# Patient Record
Sex: Male | Born: 1950 | Race: Black or African American | Hispanic: No | Marital: Married | Smoking: Former smoker
Health system: Southern US, Community
[De-identification: ages and names within clinical notes are randomized; demographics above are authoritative.]

## PROBLEM LIST (undated history)

## (undated) DIAGNOSIS — I4581 Long QT syndrome: Secondary | ICD-10-CM

## (undated) DIAGNOSIS — E1165 Type 2 diabetes mellitus with hyperglycemia: Secondary | ICD-10-CM

## (undated) DIAGNOSIS — G2581 Restless legs syndrome: Secondary | ICD-10-CM

## (undated) DIAGNOSIS — R7302 Impaired glucose tolerance (oral): Secondary | ICD-10-CM

## (undated) DIAGNOSIS — N529 Male erectile dysfunction, unspecified: Secondary | ICD-10-CM

## (undated) DIAGNOSIS — F32A Depression, unspecified: Secondary | ICD-10-CM

## (undated) DIAGNOSIS — K219 Gastro-esophageal reflux disease without esophagitis: Secondary | ICD-10-CM

## (undated) DIAGNOSIS — N400 Enlarged prostate without lower urinary tract symptoms: Secondary | ICD-10-CM

## (undated) DIAGNOSIS — F200 Paranoid schizophrenia: Secondary | ICD-10-CM

## (undated) DIAGNOSIS — I10 Essential (primary) hypertension: Secondary | ICD-10-CM

## (undated) DIAGNOSIS — F329 Major depressive disorder, single episode, unspecified: Secondary | ICD-10-CM

## (undated) DIAGNOSIS — E785 Hyperlipidemia, unspecified: Secondary | ICD-10-CM

## (undated) HISTORY — DX: Paranoid schizophrenia: F20.0

## (undated) HISTORY — DX: Gastro-esophageal reflux disease without esophagitis: K21.9

## (undated) HISTORY — DX: Impaired glucose tolerance (oral): R73.02

## (undated) HISTORY — DX: Restless legs syndrome: G25.81

## (undated) HISTORY — DX: Male erectile dysfunction, unspecified: N52.9

## (undated) HISTORY — DX: Hyperlipidemia, unspecified: E78.5

## (undated) HISTORY — DX: Benign prostatic hyperplasia without lower urinary tract symptoms: N40.0

## (undated) HISTORY — DX: Major depressive disorder, single episode, unspecified: F32.9

## (undated) HISTORY — DX: Depression, unspecified: F32.A

## (undated) HISTORY — DX: Long QT syndrome: I45.81

## (undated) HISTORY — DX: Essential (primary) hypertension: I10

## (undated) HISTORY — DX: Type 2 diabetes mellitus with hyperglycemia: E11.65

---

## 2001-02-05 ENCOUNTER — Emergency Department (HOSPITAL_COMMUNITY): Admission: EM | Admit: 2001-02-05 | Discharge: 2001-02-05 | Payer: Self-pay | Admitting: Emergency Medicine

## 2001-02-05 ENCOUNTER — Encounter: Payer: Self-pay | Admitting: Emergency Medicine

## 2002-01-06 ENCOUNTER — Emergency Department (HOSPITAL_COMMUNITY): Admission: EM | Admit: 2002-01-06 | Discharge: 2002-01-06 | Payer: Self-pay | Admitting: Emergency Medicine

## 2002-05-26 ENCOUNTER — Inpatient Hospital Stay (HOSPITAL_COMMUNITY): Admission: EM | Admit: 2002-05-26 | Discharge: 2002-05-31 | Payer: Self-pay | Admitting: Psychiatry

## 2005-08-03 ENCOUNTER — Inpatient Hospital Stay (HOSPITAL_COMMUNITY): Admission: EM | Admit: 2005-08-03 | Discharge: 2005-08-09 | Payer: Self-pay | Admitting: Emergency Medicine

## 2005-08-10 HISTORY — PX: KNEE SURGERY: SHX244

## 2005-09-03 ENCOUNTER — Ambulatory Visit: Payer: Self-pay | Admitting: Internal Medicine

## 2006-03-04 ENCOUNTER — Ambulatory Visit: Payer: Self-pay | Admitting: Internal Medicine

## 2006-03-08 ENCOUNTER — Ambulatory Visit: Payer: Self-pay | Admitting: Internal Medicine

## 2006-03-27 ENCOUNTER — Ambulatory Visit: Payer: Self-pay | Admitting: *Deleted

## 2006-04-05 ENCOUNTER — Ambulatory Visit: Payer: Self-pay

## 2007-05-20 ENCOUNTER — Ambulatory Visit: Payer: Self-pay | Admitting: Internal Medicine

## 2007-05-20 LAB — CONVERTED CEMR LAB
ALT: 19 units/L (ref 0–40)
AST: 18 units/L (ref 0–37)
Albumin: 3.5 g/dL (ref 3.5–5.2)
Alkaline Phosphatase: 58 units/L (ref 39–117)
BUN: 8 mg/dL (ref 6–23)
Basophils Absolute: 0 10*3/uL (ref 0.0–0.1)
Basophils Relative: 0.9 % (ref 0.0–1.0)
Bilirubin Urine: NEGATIVE
Bilirubin, Direct: 0.1 mg/dL (ref 0.0–0.3)
CO2: 29 meq/L (ref 19–32)
Calcium: 9 mg/dL (ref 8.4–10.5)
Chloride: 105 meq/L (ref 96–112)
Cholesterol: 227 mg/dL (ref 0–200)
Creatinine, Ser: 1.5 mg/dL (ref 0.4–1.5)
Direct LDL: 157.3 mg/dL
Eosinophils Absolute: 0.1 10*3/uL (ref 0.0–0.6)
Eosinophils Relative: 1.3 % (ref 0.0–5.0)
GFR calc Af Amer: 62 mL/min
GFR calc non Af Amer: 51 mL/min
Glucose, Bld: 98 mg/dL (ref 70–99)
HCT: 40.9 % (ref 39.0–52.0)
HDL: 25.7 mg/dL — ABNORMAL LOW (ref >39.0)
Hemoglobin, Urine: NEGATIVE
Hemoglobin: 13.8 g/dL (ref 13.0–17.0)
Ketones, ur: NEGATIVE mg/dL
Leukocytes, UA: NEGATIVE
Lymphocytes Relative: 39.1 % (ref 12.0–46.0)
MCHC: 33.7 g/dL (ref 30.0–36.0)
MCV: 89.2 fL (ref 78.0–100.0)
Monocytes Absolute: 0.7 10*3/uL (ref 0.2–0.7)
Monocytes Relative: 12.7 % — ABNORMAL HIGH (ref 3.0–11.0)
Neutro Abs: 2.4 10*3/uL (ref 1.4–7.7)
Neutrophils Relative %: 46 % (ref 43.0–77.0)
Nitrite: NEGATIVE
PSA: 3.23 ng/mL (ref 0.10–4.00)
Platelets: 358 10*3/uL (ref 150–400)
Potassium: 3.8 meq/L (ref 3.5–5.1)
RBC: 4.59 M/uL (ref 4.22–5.81)
RDW: 15 % — ABNORMAL HIGH (ref 11.5–14.6)
Sodium: 140 meq/L (ref 135–145)
Specific Gravity, Urine: 1.015 (ref 1.000–1.03)
TSH: 1.73 microintl units/mL (ref 0.35–5.50)
Total Bilirubin: 0.7 mg/dL (ref 0.3–1.2)
Total CHOL/HDL Ratio: 8.8
Total Protein, Urine: NEGATIVE mg/dL
Total Protein: 7.3 g/dL (ref 6.0–8.3)
Triglycerides: 163 mg/dL — ABNORMAL HIGH (ref 0–149)
Urine Glucose: NEGATIVE mg/dL
Urobilinogen, UA: 0.2 (ref 0.0–1.0)
VLDL: 33 mg/dL (ref 0–40)
WBC: 5.2 10*3/uL (ref 4.5–10.5)
pH: 6.5 (ref 5.0–8.0)

## 2007-06-08 ENCOUNTER — Emergency Department (HOSPITAL_COMMUNITY): Admission: EM | Admit: 2007-06-08 | Discharge: 2007-06-08 | Payer: Self-pay | Admitting: Emergency Medicine

## 2008-01-13 ENCOUNTER — Ambulatory Visit: Payer: Self-pay | Admitting: Internal Medicine

## 2008-01-13 DIAGNOSIS — E785 Hyperlipidemia, unspecified: Secondary | ICD-10-CM

## 2008-01-13 DIAGNOSIS — F200 Paranoid schizophrenia: Secondary | ICD-10-CM

## 2008-01-13 DIAGNOSIS — I10 Essential (primary) hypertension: Secondary | ICD-10-CM | POA: Insufficient documentation

## 2008-01-13 DIAGNOSIS — F329 Major depressive disorder, single episode, unspecified: Secondary | ICD-10-CM

## 2008-09-07 ENCOUNTER — Ambulatory Visit: Payer: Self-pay | Admitting: Internal Medicine

## 2008-09-10 ENCOUNTER — Ambulatory Visit: Payer: Self-pay | Admitting: Internal Medicine

## 2008-09-13 LAB — CONVERTED CEMR LAB
ALT: 29 units/L (ref 0–53)
AST: 24 units/L (ref 0–37)
Albumin: 3.7 g/dL (ref 3.5–5.2)
Alkaline Phosphatase: 52 units/L (ref 39–117)
BUN: 8 mg/dL (ref 6–23)
Basophils Absolute: 0 10*3/uL (ref 0.0–0.1)
Basophils Relative: 0.5 % (ref 0.0–3.0)
Bilirubin Urine: NEGATIVE
Bilirubin, Direct: 0.1 mg/dL (ref 0.0–0.3)
CO2: 31 meq/L (ref 19–32)
Calcium: 8.8 mg/dL (ref 8.4–10.5)
Chloride: 101 meq/L (ref 96–112)
Cholesterol: 215 mg/dL (ref 0–200)
Creatinine, Ser: 1.4 mg/dL (ref 0.4–1.5)
Direct LDL: 153.7 mg/dL
Eosinophils Absolute: 0.1 10*3/uL (ref 0.0–0.7)
Eosinophils Relative: 1.1 % (ref 0.0–5.0)
GFR calc Af Amer: 67 mL/min
GFR calc non Af Amer: 55 mL/min
Glucose, Bld: 143 mg/dL — ABNORMAL HIGH (ref 70–99)
HCT: 41.3 % (ref 39.0–52.0)
HDL: 26.3 mg/dL — ABNORMAL LOW (ref >39.0)
Hemoglobin, Urine: NEGATIVE
Hemoglobin: 14 g/dL (ref 13.0–17.0)
Hgb A1c MFr Bld: 6.1 % — ABNORMAL HIGH (ref 4.6–6.0)
Ketones, ur: NEGATIVE mg/dL
Leukocytes, UA: NEGATIVE
Lymphocytes Relative: 35.8 % (ref 12.0–46.0)
MCHC: 33.8 g/dL (ref 30.0–36.0)
MCV: 90.7 fL (ref 78.0–100.0)
Monocytes Absolute: 0.4 10*3/uL (ref 0.1–1.0)
Monocytes Relative: 7 % (ref 3.0–12.0)
Neutro Abs: 3.2 10*3/uL (ref 1.4–7.7)
Neutrophils Relative %: 55.6 % (ref 43.0–77.0)
Nitrite: NEGATIVE
PSA: 3.31 ng/mL (ref 0.10–4.00)
Platelets: 343 10*3/uL (ref 150–400)
Potassium: 4 meq/L (ref 3.5–5.1)
RBC: 4.56 M/uL (ref 4.22–5.81)
RDW: 15.7 % — ABNORMAL HIGH (ref 11.5–14.6)
Sodium: 138 meq/L (ref 135–145)
Specific Gravity, Urine: 1.015 (ref 1.000–1.03)
TSH: 1.14 microintl units/mL (ref 0.35–5.50)
Total Bilirubin: 0.8 mg/dL (ref 0.3–1.2)
Total CHOL/HDL Ratio: 8.2
Total Protein, Urine: NEGATIVE mg/dL
Total Protein: 7.2 g/dL (ref 6.0–8.3)
Triglycerides: 202 mg/dL (ref 0–149)
Urine Glucose: NEGATIVE mg/dL
Urobilinogen, UA: 0.2 (ref 0.0–1.0)
VLDL: 40 mg/dL (ref 0–40)
WBC: 5.7 10*3/uL (ref 4.5–10.5)
pH: 6.5 (ref 5.0–8.0)

## 2008-09-23 ENCOUNTER — Encounter (INDEPENDENT_AMBULATORY_CARE_PROVIDER_SITE_OTHER): Payer: Self-pay | Admitting: *Deleted

## 2009-09-16 ENCOUNTER — Ambulatory Visit: Payer: Self-pay | Admitting: Internal Medicine

## 2009-09-16 DIAGNOSIS — F17200 Nicotine dependence, unspecified, uncomplicated: Secondary | ICD-10-CM | POA: Insufficient documentation

## 2009-09-16 DIAGNOSIS — R5383 Other fatigue: Secondary | ICD-10-CM

## 2009-09-16 DIAGNOSIS — R5381 Other malaise: Secondary | ICD-10-CM

## 2009-09-19 DIAGNOSIS — R972 Elevated prostate specific antigen [PSA]: Secondary | ICD-10-CM

## 2009-09-20 LAB — CONVERTED CEMR LAB
AST: 21 units/L (ref 0–37)
Albumin: 3.8 g/dL (ref 3.5–5.2)
Alkaline Phosphatase: 56 units/L (ref 39–117)
Basophils Absolute: 0 10*3/uL (ref 0.0–0.1)
Basophils Relative: 0.6 % (ref 0.0–3.0)
Bilirubin, Direct: 0.1 mg/dL (ref 0.0–0.3)
Calcium: 9.6 mg/dL (ref 8.4–10.5)
Creatinine, Ser: 1.8 mg/dL — ABNORMAL HIGH (ref 0.4–1.5)
Direct LDL: 175.8 mg/dL
Eosinophils Absolute: 0.1 10*3/uL (ref 0.0–0.7)
GFR calc non Af Amer: 49.75 mL/min (ref >60)
HDL: 27.4 mg/dL — ABNORMAL LOW (ref >39.00)
Hemoglobin: 13.9 g/dL (ref 13.0–17.0)
Lymphocytes Relative: 32.5 % (ref 12.0–46.0)
Monocytes Relative: 10.1 % (ref 3.0–12.0)
Neutro Abs: 3.3 10*3/uL (ref 1.4–7.7)
Neutrophils Relative %: 55.3 % (ref 43.0–77.0)
PSA: 4.46 ng/mL — ABNORMAL HIGH (ref 0.10–4.00)
RBC: 4.63 M/uL (ref 4.22–5.81)
Sodium: 141 meq/L (ref 135–145)
Total CHOL/HDL Ratio: 8
Total Protein: 7 g/dL (ref 6.0–8.3)
Triglycerides: 157 mg/dL — ABNORMAL HIGH (ref 0.0–149.0)

## 2009-09-30 ENCOUNTER — Encounter: Payer: Self-pay | Admitting: Internal Medicine

## 2009-10-26 ENCOUNTER — Encounter: Payer: Self-pay | Admitting: Internal Medicine

## 2010-02-23 ENCOUNTER — Encounter: Payer: Self-pay | Admitting: Internal Medicine

## 2010-09-18 ENCOUNTER — Ambulatory Visit: Payer: Self-pay | Admitting: Internal Medicine

## 2010-09-18 ENCOUNTER — Encounter: Payer: Self-pay | Admitting: Internal Medicine

## 2010-09-18 DIAGNOSIS — K219 Gastro-esophageal reflux disease without esophagitis: Secondary | ICD-10-CM

## 2010-09-18 DIAGNOSIS — R21 Rash and other nonspecific skin eruption: Secondary | ICD-10-CM

## 2010-09-18 LAB — CONVERTED CEMR LAB
ALT: 22 units/L (ref 0–53)
AST: 22 units/L (ref 0–37)
Alkaline Phosphatase: 53 units/L (ref 39–117)
BUN: 10 mg/dL (ref 6–23)
Basophils Relative: 1.5 % (ref 0.0–3.0)
Bilirubin Urine: NEGATIVE
Bilirubin, Direct: 0.1 mg/dL (ref 0.0–0.3)
Chloride: 104 meq/L (ref 96–112)
Creatinine, Ser: 1.4 mg/dL (ref 0.4–1.5)
Eosinophils Relative: 2.4 % (ref 0.0–5.0)
GFR calc non Af Amer: 65.73 mL/min (ref >60)
Hemoglobin, Urine: NEGATIVE
LDL Cholesterol: 119 mg/dL — ABNORMAL HIGH (ref 0–99)
Lymphocytes Relative: 37.9 % (ref 12.0–46.0)
Monocytes Relative: 10.5 % (ref 3.0–12.0)
Neutrophils Relative %: 47.7 % (ref 43.0–77.0)
Nitrite: NEGATIVE
Platelets: 363 10*3/uL (ref 150.0–400.0)
RBC: 4.78 M/uL (ref 4.22–5.81)
TSH: 1.38 microintl units/mL (ref 0.35–5.50)
Total Bilirubin: 0.8 mg/dL (ref 0.3–1.2)
Total CHOL/HDL Ratio: 5
Total Protein, Urine: NEGATIVE mg/dL
Total Protein: 7.7 g/dL (ref 6.0–8.3)
Triglycerides: 87 mg/dL (ref 0.0–149.0)
Urobilinogen, UA: 0.2 (ref 0.0–1.0)
WBC: 5.9 10*3/uL (ref 4.5–10.5)

## 2010-09-20 ENCOUNTER — Telehealth: Payer: Self-pay | Admitting: Internal Medicine

## 2010-09-26 ENCOUNTER — Encounter (INDEPENDENT_AMBULATORY_CARE_PROVIDER_SITE_OTHER): Payer: Self-pay | Admitting: *Deleted

## 2010-11-15 ENCOUNTER — Encounter: Payer: Self-pay | Admitting: Internal Medicine

## 2010-12-18 ENCOUNTER — Encounter: Payer: Self-pay | Admitting: Internal Medicine

## 2011-01-11 NOTE — Miscellaneous (Signed)
Summary: Orders Update  Clinical Lists Changes  Orders: Added new Referral order of Urology Referral (Urology) - Signed 

## 2011-01-11 NOTE — Miscellaneous (Signed)
Summary: Orders Update   Clinical Lists Changes  Orders: Added new Service order of Est. Patient 40-64 years ([id number]) - Signed

## 2011-01-11 NOTE — Letter (Signed)
Summary: Referral - not able to see patient  [facility]  [address] [place], [place] [address]   Phone: [phone]  Fax: [phone]    February 23, 2010    Corwin Levins, M.D. [address] [place], [place] [address]   Re:   Calvin Wilcox DOB:  Nov 28, 1951 MRN:   [id number]    Dear Dr. [name]:  Thank you for your kind referral of the above patient.  We have attempted to schedule the recommended procedure Screening Colonoscopy but have not been able to schedule because:   X  The patient was not available by phone and/or has not returned our calls.  ___ The patient declined to schedule the procedure at this time.  We appreciate the referral and hope that we will have the opportunity to treat this patient in the future.    Sincerely,    [facility] [phone]

## 2011-01-11 NOTE — Letter (Signed)
Summary: [facility]  [facility]   Imported By: Lester [place] 11/24/2010 10:24:14  _____________________________________________________________________  External Attachment:    Type:   Image     Comment:   External Document

## 2011-01-11 NOTE — Letter (Signed)
Summary: [facility] Consult Scheduled Letter  [facility]  [address] [place], [place] [address]   Phone: [phone]  Fax: [phone]      09/26/2010 MRN: [id number]  [facility] [address] [place], [place]  [address]    Dear Mr. Favaro,      We have scheduled an appointment for you.  At the recommendation of Dr.John, we have scheduled you a consult with Dr Laverle Patter on 10/06/10 at 1:30pm.  Their phone number is [phone].  If this appointment day and time is not convenient for you, please feel free to call the office of the doctor you are being referred to at the number listed above and reschedule the appointment.    [facility] [address] [place], [place] [address]   Enclosure:Directions   Thank you,  Patient Care Coordinator [facility]

## 2011-01-11 NOTE — Progress Notes (Signed)
  Phone Note Outgoing Call   Call placed by: Robin Call placed to: Patient Summary of Call: called patient and informed pts. wife to hold on Simvastatin while patient is taking Ketoconoazole per [employer] instructions. Patients wife agreed to inform pt. and also informed pharmacy that pt. was informed of instructions. Initial call taken by: Robin Ewing CMA (AAMA),  September 20, 2010 9:20 AM
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
## 2011-01-11 NOTE — Letter (Signed)
Summary: Referral - not able to see patient  [facility]  [address] [place], [place] [address]   Phone: [phone]  Fax: [phone]    December 18, 2010   Oliver Barre, MD [address] [place], [place] [address]   Re:   Calvin Wilcox DOB:  Dec 30, 1950 MRN:   [id number]    Dear Dr. [name]:  Thank you for your kind referral of the above patient.  We have attempted to schedule the recommended procedure Screening Colonoscopy but have not been able to schedule because:  X   The patient was not available by phone and/or has not returned our calls.  ___ The patient declined to schedule the procedure at this time.  We appreciate the referral and hope that we will have the opportunity to treat this patient in the future.    Sincerely,    [facility] [phone]
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
## 2011-04-27 NOTE — H&P (Signed)
[facility]  Patient:    Calvin Wilcox, Calvin Wilcox Visit Number: [id number] MRN: [id number]          Service Type: PSY Location: 400 [id number] 02 Attending Physician:  Rachael Fee Dictated by:   Candi Leash. Orsini, N.P. Admit Date:  05/26/2002                     Psychiatric Admission Assessment  IDENTIFYING INFORMATION:  This is a 60 year old divorced African-American male voluntarily admitted for psychosis on May 26, 2002.  HISTORY OF PRESENT ILLNESS:  The patient presents with a history of psychosis, having positive auditory hallucinations, the command-type to "watch the lady," speaking about his girlfriend.  He has a history of auditory hallucinations for at least six years.  He has never been treated.  He reports that he believes his girlfriend is cheating on him.  The patient states he crawls on the floor.  He peeps around to watch her.  He is looking for any sort of sexual activity.  Putting his ear to the wall, trying to hear her having sex. He reports his mother recently found out that he has been hearing voices and advised him to seek treatment.  He reports some depressive symptoms.  Denies any specific stressors.  He reports decreased sleep.  States the voices wake him up at night.  His appetite is satisfactory.  He has a past history of homicidal thoughts towards his girlfriend in the past but none now.  He has never been on any medication.  PAST PSYCHIATRIC HISTORY:  First hospitalization to [facility]. No other hospitalizations.  No outpatient treatment.  No history of a suicide attempt.  No history of violence.  SOCIAL HISTORY:  This is a 60 year old divorced, African-American male, divorced for one year.  He has three children, one stepdaughter.  He lives with his girlfriend.  Her name is Elma.  He works at [employer].  He has completed the 12th grade.  He has a court date pending for drug paraphernalia.  He is currently on probation for  drug paraphernalia and regarding unauthorized unemployment.  FAMILY HISTORY:  None.  ALCOHOL/DRUG HISTORY:  The patient smokes.  He uses beer and wine on occasion. States it is not a problem for him.  Has been using crack cocaine on occasion. No history of IV drug use.  PRIMARY CARE [place] Dietrick:  Dr. [name] in [place].  MEDICAL PROBLEMS:  None.  MEDICATIONS:  None.  DRUG ALLERGIES:  No known allergies.  PHYSICAL EXAMINATION:  VITAL SIGNS:  The patient is 5 feet 9 inches tall.  He is 144 pounds.  His heart rate is 66, respiratory rate is 20, blood pressure 140/80.  GENERAL APPEARANCE:  The patient is a 60 year old African-American male in no acute distress.  He is average weight and appears his stated age.  The patient is unkempt, alert and cooperative.  HEENT:  Head is normocephalic.  Hair is short, clean.  He can raise his eyebrows.  Hair is evenly distributed.  EOMs are intact bilaterally.  External ear canals are patent.  Hearing is appropriate to conversation.  No sinus tenderness.  No nasal discharge.  Poor dentition.  The patient has multiple missing and broken teeth on the top.  No lesions were seen.  Tongue protrudes midline without tremor.  NECK:  Supple.  No JVD.  Negative lymphadenopathy.  Thyroid is nonpalpable, nontender.  CHEST:  Clear to auscultation.  No adventitious sounds.  No cough.  HEART:  Rate is regular rate and rhythm without murmurs, gallops or rubs.  GENITALIA:  Exam is deferred.  ABDOMEN:  Soft, flat, nontender abdomen.  MUSCULOSKELETAL:  No joint swelling or deformity.  Good range of motion. Muscle strength and tone is equal bilaterally.  No signs of injury.  SKIN:  Warm and dry with good turgor.  Strong bilateral radial pulses.  Nail beds are pink with good capillary refill.  NEURO:  Cranial nerves are grossly intact.  Good grip strength bilaterally. No involuntary movements.  Cerebellar function intact with heel to shin and normal  alternating movements.  LABORATORY DATA:  CBC within normal limits.  CMET within normal limits. Alcohol level was less than 5.  MENTAL STATUS EXAMINATION:  Middle-aged, average weight, casually dressed, calm, cooperative male with good eye contact.  Not guarded.  Speech is clear. Affect is flat.  Thought processes are positive paranoia, positive delusions, positive auditory hallucinations, positive visual hallucinations, seeing his girlfriend at times.  No suicidal or homicidal ideation.  Cognitive function intact.  Memory is good.  Judgment is fair.  Insight is fair.  The patient realizes that this is not appropriate.  DIAGNOSES: Axis I:    1. Psychosis not otherwise specified.            2. Rule out major depression with psychotic features.            3. Rule out substance-induced psychosis. Axis II:   Deferred. Axis III:  None. Axis IV:   Problems with primary support group. Axis V:    Current 35; this past year 56.  PLAN:  Voluntary admission for psychosis.  Contract for safety.  Check every 15 minutes.  Will obtain labs.  Will initiate Risperdal for psychosis and paranoid behavior.  Attend groups.  Have a family session with girlfriend prior to discharge.  TENTATIVE LENGTH OF STAY:  Four to six days. Dictated by:   Candi Leash. Orsini, N.P. Attending Physician:  Rachael Fee DD:  05/27/02 TD:  05/27/02 Job: [id number]
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
## 2011-04-27 NOTE — Discharge Summary (Signed)
NAME:  Calvin Wilcox, Calvin Wilcox                             ACCOUNT NO.:  [id number]   MEDICAL RECORD NO.:  [id number]                   PATIENT TYPE:  PS   LOCATION:  0407                                 FACILITY:  [facility]   PHYSICIAN:  Irving A. Dub Mikes, M.D.                DATE OF BIRTH:  [birth year]   DATE OF ADMISSION:  05/26/2002  DATE OF DISCHARGE:  05/31/2002                                 DISCHARGE SUMMARY   CHIEF COMPLAINT AND PRESENT ILLNESS:  This was the first admission to [facility] for this 60 year old divorced, African-American male  voluntarily admitted for psychosis.  Positive auditory hallucinations,  command-type, to watch the lady, speaking about his girlfriend.  History  of auditory hallucinations for at least six years.  Never been treated.  Feels like his girlfriend cheated on him, clothes on the floor, he peeks  around and watch her.  He is looking for any sort of sexual activity.  Putting his ear to the wall, trying to hear her having sex.  Mother recently  found out that he has been hearing voices and advised him to seek treatment.   PAST PSYCHIATRIC HISTORY:  First time at [facility].  No previous  treatment.   ALCOHOL/DRUG HISTORY:  Marijuana on occasion.  Not a problem.  Has been  using crack cocaine on occasion.  No history of IV drug use.   PAST MEDICAL HISTORY:  Noncontributory.   MEDICATIONS:  The patient is not taking any medications.   MENTAL STATUS EXAM:  Well-nourished, well-developed, alert, cooperative male  who is initially quite anxious, overwhelmed of the fact that he was hearing  these voices but, at the same time, relieved that he was able to say  something and was getting treatment.  Did endorse the auditory  hallucinations as well as ideas that the girlfriend might be having these  affairs.  He is having a hard time getting this out of his mind.  Denied any  suicidal or homicidal ideation.  Cognition well-preserved.   ADMISSION DIAGNOSES:   AXIS I:  1. Psychotic disorder not otherwise specified.  2. Rule out cocaine abuse.   AXIS II:  No diagnosis.   AXIS III:  No diagnosis.   AXIS IV:  Moderate.   AXIS V:  Global Assessment of Functioning on admission 35; highest Global  Assessment of Functioning in the last year 75.   LABORATORY DATA:  CBC was within normal limits.  Blood chemistries were  within normal limits.  Thyroid profile was within normal limits.   HOSPITAL COURSE:  He was admitted and started intensive individual and group  psychotherapy.  He was started on Risperdal that he tolerated quite well.  His medication was optimized to 0.5 mg of Risperdal twice a day and 2 mg at  bedtime.  Slowly, he admitted that the voices were fading and going away.  He was stating that now he knew that they were not real.  He was willing to  continue taking the medication and still pursue the relationship with the  girlfriend.  On May 31, 2002, it was felt he had obtained full benefit from  the hospitalization, marked decrease in the auditory hallucinations with no  hallucinations in the last 48 hours.  Reported this before the discharge.  No suicidal ideation.  No homicidal ideation.  Willing and motivated to  pursue outpatient treatment.   DISCHARGE DIAGNOSES:   AXIS I:  Psychotic disorder not otherwise specified.   AXIS II:  No diagnosis.   AXIS III:  No diagnosis.   AXIS IV:  Moderate.   AXIS V:  Global Assessment of Functioning upon discharge 55.   DISCHARGE MEDICATIONS:  1. Risperdal 0.5 mg twice a day and 2 mg at bedtime.  2. Ambien 10 mg at bedtime as needed for sleep.   FOLLOW UP:  [facility].                                               Madie Reno A. Dub Mikes, M.D.    IAL/MEDQ  D:  07/15/2002  T:  07/17/2002  Job:  [id number]

## 2011-06-21 ENCOUNTER — Emergency Department (HOSPITAL_COMMUNITY): Payer: Medicare Other

## 2011-06-21 ENCOUNTER — Emergency Department (HOSPITAL_COMMUNITY)
Admission: EM | Admit: 2011-06-21 | Discharge: 2011-06-21 | Disposition: A | Discharge status: Home / Self Care | Payer: Medicare Other | Attending: Emergency Medicine | Admitting: Emergency Medicine

## 2011-06-21 DIAGNOSIS — M545 Low back pain, unspecified: Secondary | ICD-10-CM | POA: Insufficient documentation

## 2011-06-21 DIAGNOSIS — S8000XA Contusion of unspecified knee, initial encounter: Secondary | ICD-10-CM | POA: Insufficient documentation

## 2011-06-21 DIAGNOSIS — IMO0002 Reserved for concepts with insufficient information to code with codable children: Secondary | ICD-10-CM | POA: Insufficient documentation

## 2011-06-21 DIAGNOSIS — M542 Cervicalgia: Secondary | ICD-10-CM | POA: Insufficient documentation

## 2011-06-21 DIAGNOSIS — M79609 Pain in unspecified limb: Secondary | ICD-10-CM | POA: Insufficient documentation

## 2011-06-21 DIAGNOSIS — I10 Essential (primary) hypertension: Secondary | ICD-10-CM | POA: Insufficient documentation

## 2011-06-21 DIAGNOSIS — S335XXA Sprain of ligaments of lumbar spine, initial encounter: Secondary | ICD-10-CM | POA: Insufficient documentation

## 2011-06-21 DIAGNOSIS — E7800 Pure hypercholesterolemia, unspecified: Secondary | ICD-10-CM | POA: Insufficient documentation

## 2011-06-22 ENCOUNTER — Encounter: Payer: Self-pay | Admitting: Internal Medicine

## 2011-06-22 ENCOUNTER — Encounter: Payer: Self-pay | Admitting: *Deleted

## 2011-06-22 ENCOUNTER — Telehealth: Payer: Self-pay | Admitting: *Deleted

## 2011-06-22 ENCOUNTER — Ambulatory Visit (INDEPENDENT_AMBULATORY_CARE_PROVIDER_SITE_OTHER): Payer: Medicare Other | Admitting: Internal Medicine

## 2011-06-22 DIAGNOSIS — Z0000 Encounter for general adult medical examination without abnormal findings: Secondary | ICD-10-CM

## 2011-06-22 DIAGNOSIS — I10 Essential (primary) hypertension: Secondary | ICD-10-CM

## 2011-06-22 DIAGNOSIS — F329 Major depressive disorder, single episode, unspecified: Secondary | ICD-10-CM

## 2011-06-22 DIAGNOSIS — R079 Chest pain, unspecified: Secondary | ICD-10-CM | POA: Insufficient documentation

## 2011-06-22 MED ORDER — TRAMADOL HCL 50 MG PO TABS: 50.0000 mg | ORAL_TABLET | Freq: Four times a day (QID) | ORAL | Status: AC | PRN

## 2011-06-22 NOTE — Assessment & Plan Note (Signed)
C/w msk pain, some improved, without complication;  For pain med refill, d/w pt and sister, no further eval or tx needed

## 2011-06-22 NOTE — Telephone Encounter (Signed)
[facility] [employer]. 
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
## 2011-06-22 NOTE — Patient Instructions (Signed)
Take all new medications as prescribed Continue all other medications as before Please return in 3 mo with Lab testing done 3-5 days before  

## 2011-06-22 NOTE — Assessment & Plan Note (Signed)
stable overall by hx and exam, most recent data reviewed with pt, and pt to continue medical treatment as before  BP Readings from Last 3 Encounters:  06/22/11 124/80  09/18/10 112/80  09/16/09 110/80

## 2011-06-22 NOTE — Assessment & Plan Note (Signed)
stable overall by hx and exam, most recent data reviewed with pt, and pt to continue medical treatment as before  Lab Results  Component Value Date   WBC 5.9 09/18/2010   HGB 14.6 09/18/2010   HCT 42.9 09/18/2010   PLT 363.0 09/18/2010   CHOL 168 09/18/2010   TRIG 87.0 09/18/2010   HDL 31.90* 09/18/2010   LDLDIRECT 175.8 09/16/2009   ALT 22 09/18/2010   AST 22 09/18/2010   NA 139 09/18/2010   K 4.7 09/18/2010   CL 104 09/18/2010   CREATININE 1.4 09/18/2010   BUN 10 09/18/2010   CO2 30 09/18/2010   TSH 1.38 09/18/2010   PSA 5.58* 09/18/2010   HGBA1C 6.1* 09/10/2008

## 2011-06-26 ENCOUNTER — Encounter: Payer: Self-pay | Admitting: Internal Medicine

## 2011-06-26 ENCOUNTER — Ambulatory Visit (INDEPENDENT_AMBULATORY_CARE_PROVIDER_SITE_OTHER): Payer: Medicare Other | Admitting: Internal Medicine

## 2011-06-26 ENCOUNTER — Ambulatory Visit: Payer: Medicare Other | Admitting: Internal Medicine

## 2011-06-26 ENCOUNTER — Other Ambulatory Visit (INDEPENDENT_AMBULATORY_CARE_PROVIDER_SITE_OTHER): Payer: Medicare Other

## 2011-06-26 VITALS — BP 110/70 | HR 75 | Temp 98.3°F | Ht 69.0 in | Wt 192.5 lb

## 2011-06-26 DIAGNOSIS — R310 Gross hematuria: Secondary | ICD-10-CM

## 2011-06-26 DIAGNOSIS — R079 Chest pain, unspecified: Secondary | ICD-10-CM

## 2011-06-26 DIAGNOSIS — K219 Gastro-esophageal reflux disease without esophagitis: Secondary | ICD-10-CM

## 2011-06-26 DIAGNOSIS — I10 Essential (primary) hypertension: Secondary | ICD-10-CM

## 2011-06-26 LAB — URINALYSIS, ROUTINE W REFLEX MICROSCOPIC
Nitrite: NEGATIVE
Specific Gravity, Urine: 1.025 (ref 1.000–1.030)
Urobilinogen, UA: 2 (ref 0.0–1.0)
pH: 6 (ref 5.0–8.0)

## 2011-06-26 MED ORDER — CEPHALEXIN 500 MG PO CAPS: 500.0000 mg | ORAL_CAPSULE | Freq: Four times a day (QID) | ORAL | Status: AC
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
## 2011-06-26 NOTE — Assessment & Plan Note (Signed)
Resolved, no further eval or tx needed

## 2011-06-26 NOTE — Assessment & Plan Note (Signed)
stable overall by hx and exam, and pt to continue medical treatment as before 

## 2011-06-26 NOTE — Patient Instructions (Addendum)
Please go to LAB in the Basement for the urine tests to be done today Continue all other medications as before No need for antibiotics based on your exam, but we will call in antibiotic if it seems an infection on the urine study You will be contacted regarding the referral for: urology (to make sure no cancer in the urinary tract)
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
## 2011-06-26 NOTE — Assessment & Plan Note (Signed)
stable overall by hx and exam, most recent data reviewed with pt, and pt to continue medical treatment as before  BP Readings from Last 3 Encounters:  06/26/11 110/70  06/22/11 124/80  09/18/10 112/80

## 2011-06-30 LAB — URINE CULTURE

## 2011-09-21 ENCOUNTER — Other Ambulatory Visit (INDEPENDENT_AMBULATORY_CARE_PROVIDER_SITE_OTHER): Payer: Medicare Other

## 2011-09-21 ENCOUNTER — Ambulatory Visit (INDEPENDENT_AMBULATORY_CARE_PROVIDER_SITE_OTHER): Payer: Medicare Other | Admitting: Internal Medicine

## 2011-09-21 ENCOUNTER — Encounter: Payer: Self-pay | Admitting: Internal Medicine

## 2011-09-21 VITALS — BP 110/72 | HR 56 | Temp 97.6°F | Ht 69.0 in | Wt 192.0 lb

## 2011-09-21 DIAGNOSIS — Z125 Encounter for screening for malignant neoplasm of prostate: Secondary | ICD-10-CM

## 2011-09-21 DIAGNOSIS — Z0000 Encounter for general adult medical examination without abnormal findings: Secondary | ICD-10-CM

## 2011-09-21 DIAGNOSIS — I10 Essential (primary) hypertension: Secondary | ICD-10-CM

## 2011-09-21 DIAGNOSIS — Z79899 Other long term (current) drug therapy: Secondary | ICD-10-CM

## 2011-09-21 DIAGNOSIS — E785 Hyperlipidemia, unspecified: Secondary | ICD-10-CM

## 2011-09-21 DIAGNOSIS — K219 Gastro-esophageal reflux disease without esophagitis: Secondary | ICD-10-CM

## 2011-09-21 LAB — CBC WITH DIFFERENTIAL/PLATELET
Basophils Absolute: 0.1 10*3/uL (ref 0.0–0.1)
Eosinophils Absolute: 0 10*3/uL (ref 0.0–0.7)
Hemoglobin: 14.9 g/dL (ref 13.0–17.0)
Lymphocytes Relative: 42.4 % (ref 12.0–46.0)
MCHC: 32.6 g/dL (ref 30.0–36.0)
Monocytes Relative: 9.4 % (ref 3.0–12.0)
Neutrophils Relative %: 46.8 % (ref 43.0–77.0)
RBC: 5.13 Mil/uL (ref 4.22–5.81)
RDW: 16.6 % — ABNORMAL HIGH (ref 11.5–14.6)

## 2011-09-21 LAB — BASIC METABOLIC PANEL
BUN: 9 mg/dL (ref 6–23)
CO2: 27 mEq/L (ref 19–32)
Calcium: 9 mg/dL (ref 8.4–10.5)
Chloride: 105 mEq/L (ref 96–112)
Creatinine, Ser: 1.5 mg/dL (ref 0.4–1.5)
Glucose, Bld: 87 mg/dL (ref 70–99)

## 2011-09-21 LAB — LIPID PANEL
LDL Cholesterol: 101 mg/dL — ABNORMAL HIGH (ref 0–99)
Total CHOL/HDL Ratio: 4

## 2011-09-21 LAB — HEPATIC FUNCTION PANEL
AST: 22 U/L (ref 0–37)
Alkaline Phosphatase: 55 U/L (ref 39–117)
Bilirubin, Direct: 0.1 mg/dL (ref 0.0–0.3)

## 2011-09-21 LAB — TSH: TSH: 1.36 u[IU]/mL (ref 0.35–5.50)

## 2011-09-21 NOTE — Patient Instructions (Addendum)
Please take your Aspirin 81 mg - 1 per day - COATED only Continue all other medications as before Please go to LAB in the Basement for the blood and/or urine tests to be done today Please call the phone number [phone] (the [employer]Tree System) for results of testing in 2-3 days;  When calling, simply dial the number, and when prompted enter the MRN number above (the Medical Record Number) and the # key, then the message should start. Please return in 6 months, or sooner if needed

## 2011-09-22 ENCOUNTER — Encounter: Payer: Self-pay | Admitting: Internal Medicine

## 2011-09-22 NOTE — Assessment & Plan Note (Signed)
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
## 2011-09-24 LAB — URINALYSIS, ROUTINE W REFLEX MICROSCOPIC
Ketones, ur: NEGATIVE
Specific Gravity, Urine: 1.02 (ref 1.000–1.030)
Urine Glucose: NEGATIVE
Urobilinogen, UA: 0.2 (ref 0.0–1.0)
pH: 6.5 (ref 5.0–8.0)

## 2011-09-26 LAB — I-STAT 8, (EC8 V) (CONVERTED LAB)
Acid-Base Excess: 1
Chloride: 103
HCT: 47
Hemoglobin: 16
Operator id: 235561
Potassium: 4
Sodium: 139
TCO2: 28
pH, Ven: 7.394 — ABNORMAL HIGH

## 2011-09-28 ENCOUNTER — Ambulatory Visit: Payer: Medicare Other | Admitting: Internal Medicine

## 2011-10-05 ENCOUNTER — Ambulatory Visit (INDEPENDENT_AMBULATORY_CARE_PROVIDER_SITE_OTHER): Payer: Medicare Other | Admitting: Internal Medicine

## 2011-10-05 ENCOUNTER — Encounter: Payer: Self-pay | Admitting: Internal Medicine

## 2011-10-05 VITALS — BP 100/72 | HR 64 | Temp 97.9°F | Ht 69.0 in | Wt 192.2 lb

## 2011-10-05 DIAGNOSIS — Z0000 Encounter for general adult medical examination without abnormal findings: Secondary | ICD-10-CM

## 2011-10-05 DIAGNOSIS — I10 Essential (primary) hypertension: Secondary | ICD-10-CM

## 2011-10-05 DIAGNOSIS — K219 Gastro-esophageal reflux disease without esophagitis: Secondary | ICD-10-CM

## 2011-10-05 DIAGNOSIS — E785 Hyperlipidemia, unspecified: Secondary | ICD-10-CM

## 2011-10-05 DIAGNOSIS — R972 Elevated prostate specific antigen [PSA]: Secondary | ICD-10-CM

## 2011-10-05 MED ORDER — OMEPRAZOLE 20 MG PO CPDR: 20.0000 mg | DELAYED_RELEASE_CAPSULE | Freq: Two times a day (BID) | ORAL | Status: DC

## 2011-10-05 MED ORDER — SIMVASTATIN 40 MG PO TABS: 40.0000 mg | ORAL_TABLET | Freq: Every day | ORAL | Status: DC

## 2011-10-05 MED ORDER — AMLODIPINE BESYLATE 5 MG PO TABS: 5.0000 mg | ORAL_TABLET | Freq: Every day | ORAL | Status: DC

## 2011-10-05 NOTE — Patient Instructions (Addendum)
Continue all other medications as before, including the Aspirin 81 mg per day - COATED only Please keep your appointments with your specialists as you have planned - urology - Dr Laverle Patter We will fax your results to Dr Laverle Patter today You will be contacted regarding the referral for: colonoscopy Your medications were sent to the pharmacy Please return in 6 months, or sooner if needed

## 2011-10-05 NOTE — Assessment & Plan Note (Signed)
Overall doing well, age appropriate education and counseling updated, referrals for preventative services and immunizations addressed, dietary and smoking counseling addressed, most recent labs and ECG reviewed.  I have personally reviewed and have noted: 1) the patient's medical and social history 2) The pt's use of alcohol, tobacco, and illicit drugs 3) The patient's current medications and supplements 4) Functional ability including ADL's, fall risk, home safety risk, hearing and visual impairment 5) Diet and physical activities 6) Evidence for depression or mood disorder 7) The patient's height, weight, and BMI have been recorded in the chart I have made referrals, and provided counseling and education based on review of the above Due for colonscopy - will order

## 2011-10-06 ENCOUNTER — Encounter: Payer: Self-pay | Admitting: Internal Medicine

## 2011-10-06 NOTE — Assessment & Plan Note (Signed)
stable overall by hx and exam, , and pt to continue medical treatment as before   

## 2011-10-06 NOTE — Assessment & Plan Note (Signed)
stable overall by hx and exam, most recent data reviewed with pt, and pt to continue medical treatment as before  BP Readings from Last 3 Encounters:  10/05/11 100/72  09/21/11 110/72  06/26/11 110/70
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
## 2011-10-06 NOTE — Assessment & Plan Note (Signed)
stable overall by hx and exam, most recent data reviewed with pt, and pt to continue medical treatment as before  Lab Results  Component Value Date   LDLCALC 101* 09/21/2011

## 2011-10-06 NOTE — Assessment & Plan Note (Signed)
stable overall by hx and exam, most recent data reviewed with pt, and pt to continue medical treatment as before  Lab Results  Component Value Date   PSA 5.10* 09/21/2011   PSA 5.58* 09/18/2010   PSA 4.46* 09/16/2009

## 2012-10-15 ENCOUNTER — Encounter: Payer: Medicare Other | Admitting: Internal Medicine

## 2012-10-28 ENCOUNTER — Encounter: Payer: Self-pay | Admitting: Internal Medicine

## 2012-10-28 ENCOUNTER — Ambulatory Visit (INDEPENDENT_AMBULATORY_CARE_PROVIDER_SITE_OTHER): Payer: Medicare Other | Admitting: Internal Medicine

## 2012-10-28 ENCOUNTER — Other Ambulatory Visit (INDEPENDENT_AMBULATORY_CARE_PROVIDER_SITE_OTHER): Payer: Medicare Other

## 2012-10-28 VITALS — BP 122/80 | HR 62 | Temp 97.7°F | Ht 69.0 in | Wt 196.2 lb

## 2012-10-28 DIAGNOSIS — R972 Elevated prostate specific antigen [PSA]: Secondary | ICD-10-CM

## 2012-10-28 DIAGNOSIS — R300 Dysuria: Secondary | ICD-10-CM

## 2012-10-28 DIAGNOSIS — N400 Enlarged prostate without lower urinary tract symptoms: Secondary | ICD-10-CM

## 2012-10-28 DIAGNOSIS — E785 Hyperlipidemia, unspecified: Secondary | ICD-10-CM

## 2012-10-28 DIAGNOSIS — I10 Essential (primary) hypertension: Secondary | ICD-10-CM

## 2012-10-28 DIAGNOSIS — Z0000 Encounter for general adult medical examination without abnormal findings: Secondary | ICD-10-CM

## 2012-10-28 HISTORY — DX: Benign prostatic hyperplasia without lower urinary tract symptoms: N40.0

## 2012-10-28 LAB — HEPATIC FUNCTION PANEL
Bilirubin, Direct: 0.1 mg/dL (ref 0.0–0.3)
Total Bilirubin: 0.6 mg/dL (ref 0.3–1.2)

## 2012-10-28 LAB — URINALYSIS, ROUTINE W REFLEX MICROSCOPIC
Bilirubin Urine: NEGATIVE
Hgb urine dipstick: NEGATIVE
Ketones, ur: NEGATIVE
Urine Glucose: NEGATIVE
Urobilinogen, UA: 0.2 (ref 0.0–1.0)

## 2012-10-28 LAB — BASIC METABOLIC PANEL
BUN: 8 mg/dL (ref 6–23)
Calcium: 9.1 mg/dL (ref 8.4–10.5)
Creatinine, Ser: 1.7 mg/dL — ABNORMAL HIGH (ref 0.4–1.5)

## 2012-10-28 LAB — CBC WITH DIFFERENTIAL/PLATELET
Basophils Relative: 1 % (ref 0.0–3.0)
Eosinophils Absolute: 0.1 10*3/uL (ref 0.0–0.7)
Eosinophils Relative: 0.9 % (ref 0.0–5.0)
Hemoglobin: 14.3 g/dL (ref 13.0–17.0)
Lymphocytes Relative: 27.6 % (ref 12.0–46.0)
MCHC: 32.8 g/dL (ref 30.0–36.0)
MCV: 88.3 fl (ref 78.0–100.0)
Monocytes Absolute: 0.6 10*3/uL (ref 0.1–1.0)
Neutro Abs: 3.8 10*3/uL (ref 1.4–7.7)
Neutrophils Relative %: 61.4 % (ref 43.0–77.0)
RBC: 4.95 Mil/uL (ref 4.22–5.81)
WBC: 6.1 10*3/uL (ref 4.5–10.5)

## 2012-10-28 LAB — LIPID PANEL
Cholesterol: 157 mg/dL (ref 0–200)
HDL: 34.3 mg/dL — ABNORMAL LOW (ref >39.00)
LDL Cholesterol: 90 mg/dL (ref 0–99)
Total CHOL/HDL Ratio: 5
Triglycerides: 164 mg/dL — ABNORMAL HIGH (ref 0.0–149.0)
VLDL: 32.8 mg/dL (ref 0.0–40.0)

## 2012-10-28 MED ORDER — CIPROFLOXACIN HCL 500 MG PO TABS: 500.0000 mg | ORAL_TABLET | Freq: Two times a day (BID) | ORAL | Status: DC

## 2012-10-28 NOTE — Patient Instructions (Addendum)
Take all new medications as prescribed - the antibiotic Continue all other medications as before Please have the pharmacy call with any other refills you may need. Please go to LAB in the Basement for the blood and/or urine tests to be done today You will be contacted by phone if any changes need to be made immediately.  Otherwise, you will receive a letter about your results with an explanation, but please check with MyChart first. Thank you for enrolling in MyChart. Please follow the instructions below to securely access your online medical record. MyChart allows you to send messages to your doctor, view your test results, renew your prescriptions, schedule appointments, and more. To Log into MyChart, please go to [email or web address], and your Username is: [email or web address] You will be contacted regarding the referral for: urology, and the colonoscopy Please return in 1 year for your yearly visit, or sooner if needed, with Lab testing done 3-5 days before

## 2012-10-28 NOTE — Assessment & Plan Note (Signed)
For urology referral 

## 2012-10-28 NOTE — Assessment & Plan Note (Signed)
Suspect uti/prostatitis, Mild to mod, for antibx course,  to f/u any worsening symptoms or concerns, for urine cx as well

## 2012-10-28 NOTE — Assessment & Plan Note (Signed)
For fu psa 

## 2012-10-28 NOTE — Assessment & Plan Note (Signed)
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
## 2012-10-29 ENCOUNTER — Encounter: Payer: Self-pay | Admitting: Internal Medicine

## 2012-10-29 LAB — HEMOGLOBIN A1C: Hgb A1c MFr Bld: 6.4 % (ref 4.6–6.5)

## 2012-10-30 LAB — URINE CULTURE: Organism ID, Bacteria: NO GROWTH

## 2012-11-13 ENCOUNTER — Other Ambulatory Visit: Payer: Self-pay | Admitting: Internal Medicine

## 2012-12-25 ENCOUNTER — Encounter: Payer: Medicare Other | Admitting: Internal Medicine

## 2013-01-24 ENCOUNTER — Other Ambulatory Visit: Payer: Self-pay

## 2013-02-07 ENCOUNTER — Other Ambulatory Visit: Payer: Self-pay | Admitting: Internal Medicine

## 2013-02-09 NOTE — Telephone Encounter (Signed)
Med filled.  

## 2013-06-03 ENCOUNTER — Other Ambulatory Visit: Payer: Self-pay | Admitting: Internal Medicine

## 2013-09-11 ENCOUNTER — Ambulatory Visit: Payer: Medicare Other | Admitting: Internal Medicine

## 2013-10-15 ENCOUNTER — Other Ambulatory Visit: Payer: Self-pay

## 2013-10-30 ENCOUNTER — Encounter: Payer: Medicare Other | Admitting: Internal Medicine

## 2013-11-09 ENCOUNTER — Emergency Department (HOSPITAL_COMMUNITY)
Admission: EM | Admit: 2013-11-09 | Discharge: 2013-11-10 | Disposition: A | Discharge status: Home / Self Care | Payer: Medicare Other | Attending: Emergency Medicine | Admitting: Emergency Medicine

## 2013-11-09 ENCOUNTER — Encounter (HOSPITAL_COMMUNITY): Payer: Self-pay | Admitting: Emergency Medicine

## 2013-11-09 DIAGNOSIS — Z79899 Other long term (current) drug therapy: Secondary | ICD-10-CM | POA: Insufficient documentation

## 2013-11-09 DIAGNOSIS — K219 Gastro-esophageal reflux disease without esophagitis: Secondary | ICD-10-CM | POA: Insufficient documentation

## 2013-11-09 DIAGNOSIS — I10 Essential (primary) hypertension: Secondary | ICD-10-CM | POA: Insufficient documentation

## 2013-11-09 DIAGNOSIS — F329 Major depressive disorder, single episode, unspecified: Secondary | ICD-10-CM | POA: Insufficient documentation

## 2013-11-09 DIAGNOSIS — E785 Hyperlipidemia, unspecified: Secondary | ICD-10-CM | POA: Insufficient documentation

## 2013-11-09 DIAGNOSIS — R109 Unspecified abdominal pain: Secondary | ICD-10-CM | POA: Insufficient documentation

## 2013-11-09 DIAGNOSIS — F17200 Nicotine dependence, unspecified, uncomplicated: Secondary | ICD-10-CM | POA: Insufficient documentation

## 2013-11-09 DIAGNOSIS — F3289 Other specified depressive episodes: Secondary | ICD-10-CM | POA: Insufficient documentation

## 2013-11-09 DIAGNOSIS — F200 Paranoid schizophrenia: Secondary | ICD-10-CM | POA: Insufficient documentation

## 2013-11-09 DIAGNOSIS — N489 Disorder of penis, unspecified: Secondary | ICD-10-CM | POA: Insufficient documentation

## 2013-11-09 DIAGNOSIS — N4889 Other specified disorders of penis: Secondary | ICD-10-CM

## 2013-11-09 NOTE — ED Notes (Signed)
Pt reports he began having pain to his lower abdomen that shoots to his penis. Pt reports he has had difficulty urinating since then and difficulty having a BM. Pt denies any increase in frequency of urination or increase in pain when urinating.

## 2013-11-10 LAB — COMPREHENSIVE METABOLIC PANEL
ALT: 15 U/L (ref 0–53)
AST: 22 U/L (ref 0–37)
Albumin: 3.7 g/dL (ref 3.5–5.2)
Alkaline Phosphatase: 53 U/L (ref 39–117)
BUN: 12 mg/dL (ref 6–23)
CO2: 27 mEq/L (ref 19–32)
Calcium: 9.1 mg/dL (ref 8.4–10.5)
Chloride: 97 mEq/L (ref 96–112)
Creatinine, Ser: 1.71 mg/dL — ABNORMAL HIGH (ref 0.50–1.35)
GFR calc Af Amer: 47 mL/min — ABNORMAL LOW (ref >90)
GFR calc non Af Amer: 41 mL/min — ABNORMAL LOW (ref >90)
Glucose, Bld: 112 mg/dL — ABNORMAL HIGH (ref 70–99)
Potassium: 3.6 mEq/L (ref 3.5–5.1)
Sodium: 134 mEq/L — ABNORMAL LOW (ref 135–145)
Total Bilirubin: 0.4 mg/dL (ref 0.3–1.2)
Total Protein: 7.8 g/dL (ref 6.0–8.3)

## 2013-11-10 LAB — CBC WITH DIFFERENTIAL/PLATELET
Basophils Absolute: 0 10*3/uL (ref 0.0–0.1)
Basophils Relative: 0 % (ref 0–1)
Eosinophils Absolute: 0 10*3/uL (ref 0.0–0.7)
Eosinophils Relative: 0 % (ref 0–5)
HCT: 38.1 % — ABNORMAL LOW (ref 39.0–52.0)
Hemoglobin: 13.5 g/dL (ref 13.0–17.0)
Lymphocytes Relative: 10 % — ABNORMAL LOW (ref 12–46)
Lymphs Abs: 1 10*3/uL (ref 0.7–4.0)
MCH: 29.9 pg (ref 26.0–34.0)
MCHC: 35.4 g/dL (ref 30.0–36.0)
MCV: 84.3 fL (ref 78.0–100.0)
Monocytes Absolute: 0.8 10*3/uL (ref 0.1–1.0)
Monocytes Relative: 8 % (ref 3–12)
Neutro Abs: 8.3 10*3/uL — ABNORMAL HIGH (ref 1.7–7.7)
Neutrophils Relative %: 82 % — ABNORMAL HIGH (ref 43–77)
Platelets: 330 10*3/uL (ref 150–400)
RBC: 4.52 MIL/uL (ref 4.22–5.81)
RDW: 15.6 % — ABNORMAL HIGH (ref 11.5–15.5)
WBC: 10.1 10*3/uL (ref 4.0–10.5)

## 2013-11-10 LAB — URINALYSIS, ROUTINE W REFLEX MICROSCOPIC
Nitrite: NEGATIVE
Protein, ur: NEGATIVE mg/dL
Specific Gravity, Urine: 1.016 (ref 1.005–1.030)
Urobilinogen, UA: 1 mg/dL (ref 0.0–1.0)

## 2013-11-10 LAB — URINE MICROSCOPIC-ADD ON

## 2013-11-10 MED ORDER — HYDROCODONE-ACETAMINOPHEN 5-325 MG PO TABS: 1.0000 | ORAL_TABLET | Freq: Four times a day (QID) | ORAL | Status: DC | PRN
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
## 2013-11-10 NOTE — ED Provider Notes (Signed)
Medical screening examination/treatment/procedure(s) were performed by non-physician practitioner and as supervising physician I was immediately available for consultation/collaboration.  EKG Interpretation   None       Devoria Albe, MD, [facility]   Ward Givens, MD 11/10/13 [phone]

## 2013-12-15 ENCOUNTER — Other Ambulatory Visit: Payer: Self-pay | Admitting: Internal Medicine

## 2014-02-11 ENCOUNTER — Encounter: Payer: Self-pay | Admitting: Internal Medicine

## 2014-02-11 ENCOUNTER — Other Ambulatory Visit (INDEPENDENT_AMBULATORY_CARE_PROVIDER_SITE_OTHER): Payer: Medicare Other

## 2014-02-11 ENCOUNTER — Ambulatory Visit (INDEPENDENT_AMBULATORY_CARE_PROVIDER_SITE_OTHER): Payer: Medicare Other | Admitting: Internal Medicine

## 2014-02-11 VITALS — BP 120/80 | HR 69 | Temp 98.1°F | Ht 69.0 in | Wt 192.1 lb

## 2014-02-11 DIAGNOSIS — I10 Essential (primary) hypertension: Secondary | ICD-10-CM

## 2014-02-11 DIAGNOSIS — E785 Hyperlipidemia, unspecified: Secondary | ICD-10-CM

## 2014-02-11 DIAGNOSIS — R7302 Impaired glucose tolerance (oral): Secondary | ICD-10-CM

## 2014-02-11 DIAGNOSIS — E1122 Type 2 diabetes mellitus with diabetic chronic kidney disease: Secondary | ICD-10-CM | POA: Insufficient documentation

## 2014-02-11 DIAGNOSIS — Z0000 Encounter for general adult medical examination without abnormal findings: Secondary | ICD-10-CM

## 2014-02-11 DIAGNOSIS — IMO0001 Reserved for inherently not codable concepts without codable children: Secondary | ICD-10-CM

## 2014-02-11 DIAGNOSIS — E119 Type 2 diabetes mellitus without complications: Secondary | ICD-10-CM | POA: Insufficient documentation

## 2014-02-11 DIAGNOSIS — R7309 Other abnormal glucose: Secondary | ICD-10-CM

## 2014-02-11 HISTORY — DX: Reserved for inherently not codable concepts without codable children: IMO0001

## 2014-02-11 HISTORY — DX: Impaired glucose tolerance (oral): R73.02

## 2014-02-11 LAB — HEPATIC FUNCTION PANEL
ALT: 17 U/L (ref 0–53)
AST: 18 U/L (ref 0–37)
Albumin: 4.1 g/dL (ref 3.5–5.2)
Alkaline Phosphatase: 50 U/L (ref 39–117)
Bilirubin, Direct: 0.1 mg/dL (ref 0.0–0.3)
TOTAL PROTEIN: 8.2 g/dL (ref 6.0–8.3)
Total Bilirubin: 0.7 mg/dL (ref 0.3–1.2)

## 2014-02-11 LAB — BASIC METABOLIC PANEL
BUN: 8 mg/dL (ref 6–23)
CHLORIDE: 103 meq/L (ref 96–112)
CO2: 30 meq/L (ref 19–32)
CREATININE: 1.6 mg/dL — AB (ref 0.4–1.5)
Calcium: 9.3 mg/dL (ref 8.4–10.5)
GFR: 55.38 mL/min — ABNORMAL LOW (ref >60.00)
GLUCOSE: 89 mg/dL (ref 70–99)
Potassium: 4.3 mEq/L (ref 3.5–5.1)
Sodium: 140 mEq/L (ref 135–145)

## 2014-02-11 LAB — URINALYSIS, ROUTINE W REFLEX MICROSCOPIC
BILIRUBIN URINE: NEGATIVE
Hgb urine dipstick: NEGATIVE
Ketones, ur: NEGATIVE
LEUKOCYTES UA: NEGATIVE
NITRITE: NEGATIVE
RBC / HPF: NONE SEEN (ref 0–?)
Specific Gravity, Urine: 1.02 (ref 1.000–1.030)
TOTAL PROTEIN, URINE-UPE24: NEGATIVE
Urine Glucose: NEGATIVE
Urobilinogen, UA: 0.2 (ref 0.0–1.0)
pH: 6 (ref 5.0–8.0)

## 2014-02-11 LAB — CBC WITH DIFFERENTIAL/PLATELET
BASOS PCT: 0.7 % (ref 0.0–3.0)
Basophils Absolute: 0 10*3/uL (ref 0.0–0.1)
EOS PCT: 0.9 % (ref 0.0–5.0)
Eosinophils Absolute: 0.1 10*3/uL (ref 0.0–0.7)
HCT: 45 % (ref 39.0–52.0)
Hemoglobin: 14.9 g/dL (ref 13.0–17.0)
LYMPHS PCT: 35.3 % (ref 12.0–46.0)
Lymphs Abs: 2.3 10*3/uL (ref 0.7–4.0)
MCHC: 33.2 g/dL (ref 30.0–36.0)
MCV: 88.6 fl (ref 78.0–100.0)
MONO ABS: 0.7 10*3/uL (ref 0.1–1.0)
Monocytes Relative: 10.2 % (ref 3.0–12.0)
NEUTROS PCT: 52.9 % (ref 43.0–77.0)
Neutro Abs: 3.4 10*3/uL (ref 1.4–7.7)
PLATELETS: 339 10*3/uL (ref 150.0–400.0)
RBC: 5.08 Mil/uL (ref 4.22–5.81)
RDW: 17.4 % — ABNORMAL HIGH (ref 11.5–14.6)
WBC: 6.5 10*3/uL (ref 4.5–10.5)

## 2014-02-11 LAB — LIPID PANEL
Cholesterol: 156 mg/dL (ref 0–200)
HDL: 33.7 mg/dL — ABNORMAL LOW (ref >39.00)
LDL CALC: 104 mg/dL — AB (ref 0–99)
TRIGLYCERIDES: 93 mg/dL (ref 0.0–149.0)
Total CHOL/HDL Ratio: 5
VLDL: 18.6 mg/dL (ref 0.0–40.0)

## 2014-02-11 LAB — HEMOGLOBIN A1C: Hgb A1c MFr Bld: 6.7 % — ABNORMAL HIGH (ref 4.6–6.5)

## 2014-02-11 LAB — TSH: TSH: 1.93 u[IU]/mL (ref 0.35–5.50)

## 2014-02-11 MED ORDER — ASPIRIN EC 81 MG PO TBEC: 81.0000 mg | DELAYED_RELEASE_TABLET | Freq: Every day | ORAL | Status: DC

## 2014-02-11 NOTE — Progress Notes (Signed)
Pre visit review using our clinic review tool, if applicable. No additional management support is needed unless otherwise documented below in the visit note. 

## 2014-02-11 NOTE — Assessment & Plan Note (Signed)
Asymtpt, for a1c today
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
## 2014-02-11 NOTE — Patient Instructions (Addendum)
Please start Aspirin 81 mg - 1 per day - coated only  Please continue all other medications as before, and refills have been done if requested. Please have the pharmacy call with any other refills you may need.  Please continue your efforts at being more active, low cholesterol diet, and weight control. You are otherwise up to date with prevention measures today.  Please call if you change your mind about the colonoscopy  Please go to the LAB in the Basement (turn left off the elevator) for the tests to be done today You will be contacted by phone if any changes need to be made immediately.  Otherwise, you will receive a letter about your results with an explanation, but please check with MyChart first.  Please remember to sign up for MyChart if you have not done so, as this will be important to you in the future with finding out test results, communicating by private email, and scheduling acute appointments online when needed.  Please return in 6 months, or sooner if needed

## 2014-02-11 NOTE — Assessment & Plan Note (Signed)

## 2014-03-18 ENCOUNTER — Other Ambulatory Visit: Payer: Self-pay | Admitting: Internal Medicine

## 2014-03-19 ENCOUNTER — Telehealth: Payer: Self-pay

## 2014-03-19 MED ORDER — AMLODIPINE BESYLATE 5 MG PO TABS
5.0000 mg | ORAL_TABLET | Freq: Every day | ORAL | Status: DC
Start: 2014-03-19 — End: 2015-03-31

## 2014-03-19 MED ORDER — SIMVASTATIN 40 MG PO TABS: 40.0000 mg | ORAL_TABLET | Freq: Every day | ORAL | Status: DC

## 2014-03-19 NOTE — Telephone Encounter (Signed)
Patient informed. 

## 2014-03-19 NOTE — Telephone Encounter (Signed)
The patient called and is hoping to get refills on: 1) simvastatin 40mg  2) Amlodipine 5mg   [facility] on [address] [place] - [phone]

## 2014-08-17 ENCOUNTER — Ambulatory Visit: Payer: Medicare Other | Admitting: Internal Medicine

## 2014-08-24 ENCOUNTER — Other Ambulatory Visit (INDEPENDENT_AMBULATORY_CARE_PROVIDER_SITE_OTHER): Payer: Medicare Other

## 2014-08-24 ENCOUNTER — Ambulatory Visit (INDEPENDENT_AMBULATORY_CARE_PROVIDER_SITE_OTHER): Payer: Medicare Other | Admitting: Internal Medicine

## 2014-08-24 ENCOUNTER — Encounter: Payer: Self-pay | Admitting: Internal Medicine

## 2014-08-24 VITALS — BP 120/80 | HR 66 | Temp 98.2°F | Ht 69.0 in | Wt 193.1 lb

## 2014-08-24 DIAGNOSIS — R7302 Impaired glucose tolerance (oral): Secondary | ICD-10-CM

## 2014-08-24 DIAGNOSIS — E785 Hyperlipidemia, unspecified: Secondary | ICD-10-CM

## 2014-08-24 DIAGNOSIS — R7309 Other abnormal glucose: Secondary | ICD-10-CM

## 2014-08-24 DIAGNOSIS — I10 Essential (primary) hypertension: Secondary | ICD-10-CM

## 2014-08-24 LAB — HEPATIC FUNCTION PANEL
ALK PHOS: 54 U/L (ref 39–117)
ALT: 17 U/L (ref 0–53)
AST: 22 U/L (ref 0–37)
Albumin: 4 g/dL (ref 3.5–5.2)
BILIRUBIN DIRECT: 0.1 mg/dL (ref 0.0–0.3)
BILIRUBIN TOTAL: 0.5 mg/dL (ref 0.2–1.2)
Total Protein: 8 g/dL (ref 6.0–8.3)

## 2014-08-24 LAB — HEMOGLOBIN A1C: HEMOGLOBIN A1C: 6.7 % — AB (ref 4.6–6.5)

## 2014-08-24 LAB — LIPID PANEL
CHOL/HDL RATIO: 6
Cholesterol: 142 mg/dL (ref 0–200)
HDL: 25.6 mg/dL — AB (ref >39.00)
LDL Cholesterol: 80 mg/dL (ref 0–99)
NonHDL: 116.4
Triglycerides: 184 mg/dL — ABNORMAL HIGH (ref 0.0–149.0)
VLDL: 36.8 mg/dL (ref 0.0–40.0)

## 2014-08-24 LAB — BASIC METABOLIC PANEL
BUN: 10 mg/dL (ref 6–23)
CALCIUM: 9 mg/dL (ref 8.4–10.5)
CO2: 30 mEq/L (ref 19–32)
CREATININE: 1.6 mg/dL — AB (ref 0.4–1.5)
Chloride: 102 mEq/L (ref 96–112)
GFR: 55.29 mL/min — AB (ref >60.00)
Glucose, Bld: 110 mg/dL — ABNORMAL HIGH (ref 70–99)
Potassium: 3.9 mEq/L (ref 3.5–5.1)
SODIUM: 138 meq/L (ref 135–145)

## 2014-08-24 MED ORDER — ATORVASTATIN CALCIUM 40 MG PO TABS
40.0000 mg | ORAL_TABLET | Freq: Every day | ORAL | Status: DC
Start: 2014-08-24 — End: 2015-09-08

## 2014-08-24 NOTE — Assessment & Plan Note (Signed)
stable overall by history and exam, recent data reviewed with pt, and pt to continue medical treatment as before,  to f/u any worsening symptoms or concerns BP Readings from Last 3 Encounters:  08/24/14 120/80  02/11/14 120/80  11/10/13 123/89

## 2014-08-24 NOTE — Progress Notes (Signed)
Pre visit review using our clinic review tool, if applicable. No additional management support is needed unless otherwise documented below in the visit note. 

## 2014-08-24 NOTE — Assessment & Plan Note (Signed)
Cont diet control, check fu a1c,  to f/u any worsening symptoms or concerns Lab Results  Component Value Date   HGBA1C 6.7* 02/11/2014

## 2014-08-24 NOTE — Patient Instructions (Addendum)
OK to stop the simvastatin (zocor)  Please take all new medication as prescribed  - the lipitor (for cholesterol)  Please continue all other medications as before, and refills have been done if requested.  Please have the pharmacy call with any other refills you may need.  Please continue your efforts at being more active, low cholesterol diet, and weight control.  You are otherwise up to date with prevention measures today.  Please keep your appointments with your specialists as you may have planned  Please go to the LAB in the Basement (turn left off the elevator) for the tests to be done today  You will be contacted by phone if any changes need to be made immediately.  Otherwise, you will receive a letter about your results with an explanation, but please check with MyChart first.  Please remember to sign up for MyChart if you have not done so, as this will be important to you in the future with finding out test results, communicating by private email, and scheduling acute appointments online when needed.  Please return in 6 months, or sooner if needed

## 2014-08-24 NOTE — Assessment & Plan Note (Signed)
Mild uncontrolled last visit with ldl > 70 - for change simvastatin to lipitor 40,  to f/u any worsening symptoms or concerns
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
## 2014-12-01 ENCOUNTER — Encounter: Payer: Self-pay | Admitting: Internal Medicine

## 2014-12-01 ENCOUNTER — Ambulatory Visit (INDEPENDENT_AMBULATORY_CARE_PROVIDER_SITE_OTHER): Payer: Medicare Other | Admitting: Internal Medicine

## 2014-12-01 ENCOUNTER — Other Ambulatory Visit (INDEPENDENT_AMBULATORY_CARE_PROVIDER_SITE_OTHER): Payer: Medicare Other

## 2014-12-01 ENCOUNTER — Ambulatory Visit: Payer: Medicare Other | Admitting: Internal Medicine

## 2014-12-01 VITALS — BP 132/82 | HR 72 | Temp 98.0°F | Ht 69.0 in | Wt 190.2 lb

## 2014-12-01 DIAGNOSIS — N400 Enlarged prostate without lower urinary tract symptoms: Secondary | ICD-10-CM

## 2014-12-01 DIAGNOSIS — G2581 Restless legs syndrome: Secondary | ICD-10-CM

## 2014-12-01 DIAGNOSIS — I10 Essential (primary) hypertension: Secondary | ICD-10-CM

## 2014-12-01 DIAGNOSIS — E119 Type 2 diabetes mellitus without complications: Secondary | ICD-10-CM

## 2014-12-01 DIAGNOSIS — R300 Dysuria: Secondary | ICD-10-CM

## 2014-12-01 HISTORY — DX: Restless legs syndrome: G25.81

## 2014-12-01 LAB — LIPID PANEL
CHOLESTEROL: 137 mg/dL (ref 0–200)
HDL: 24.2 mg/dL — ABNORMAL LOW (ref >39.00)
LDL Cholesterol: 89 mg/dL (ref 0–99)
NonHDL: 112.8
TRIGLYCERIDES: 118 mg/dL (ref 0.0–149.0)
Total CHOL/HDL Ratio: 6
VLDL: 23.6 mg/dL (ref 0.0–40.0)

## 2014-12-01 LAB — HEPATIC FUNCTION PANEL
ALBUMIN: 4 g/dL (ref 3.5–5.2)
ALT: 22 U/L (ref 0–53)
AST: 23 U/L (ref 0–37)
Alkaline Phosphatase: 61 U/L (ref 39–117)
Bilirubin, Direct: 0.1 mg/dL (ref 0.0–0.3)
TOTAL PROTEIN: 7.9 g/dL (ref 6.0–8.3)
Total Bilirubin: 0.6 mg/dL (ref 0.2–1.2)

## 2014-12-01 LAB — URINALYSIS, ROUTINE W REFLEX MICROSCOPIC
Bilirubin Urine: NEGATIVE
Hgb urine dipstick: NEGATIVE
KETONES UR: NEGATIVE
Leukocytes, UA: NEGATIVE
Nitrite: NEGATIVE
PH: 7 (ref 5.0–8.0)
RBC / HPF: NONE SEEN (ref 0–?)
TOTAL PROTEIN, URINE-UPE24: NEGATIVE
URINE GLUCOSE: NEGATIVE
Urobilinogen, UA: 0.2 (ref 0.0–1.0)

## 2014-12-01 LAB — BASIC METABOLIC PANEL
BUN: 9 mg/dL (ref 6–23)
CALCIUM: 9 mg/dL (ref 8.4–10.5)
CO2: 29 mEq/L (ref 19–32)
Chloride: 104 mEq/L (ref 96–112)
Creatinine, Ser: 1.6 mg/dL — ABNORMAL HIGH (ref 0.4–1.5)
GFR: 54.85 mL/min — ABNORMAL LOW (ref >60.00)
GLUCOSE: 93 mg/dL (ref 70–99)
Potassium: 4.3 mEq/L (ref 3.5–5.1)
Sodium: 137 mEq/L (ref 135–145)

## 2014-12-01 LAB — HEMOGLOBIN A1C: Hgb A1c MFr Bld: 6.6 % — ABNORMAL HIGH (ref 4.6–6.5)

## 2014-12-01 MED ORDER — TAMSULOSIN HCL 0.4 MG PO CAPS: 0.4000 mg | ORAL_CAPSULE | Freq: Every day | ORAL | Status: DC

## 2014-12-01 MED ORDER — PRAMIPEXOLE DIHYDROCHLORIDE 0.25 MG PO TABS: 0.2500 mg | ORAL_TABLET | Freq: Every day | ORAL | Status: DC
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
## 2014-12-01 NOTE — Patient Instructions (Signed)
Please take all new medication as prescribed - the mirapex for restless leg at bedtime, as well as the flomax 1 per day for the prostate  Please remember you are due for seeing Dr Alinda Money in Feb 2016  Please continue all other medications as before, and refills have been done if requested.  Please have the pharmacy call with any other refills you may need.  Please keep your appointments with your specialists as you may have planned  Please go to the LAB in the Basement (turn left off the elevator) for the tests to be done today - the urine testing and some blood tests  You will be contacted by phone if any changes need to be made immediately.  Otherwise, you will receive a letter about your results with an explanation, but please check with MyChart first.  Please remember to sign up for MyChart if you have not done so, as this will be important to you in the future with finding out test results, communicating by private email, and scheduling acute appointments online when needed.  Please return in 3 months, or sooner if needed

## 2014-12-01 NOTE — Progress Notes (Signed)
Pre visit review using our clinic review tool, if applicable. No additional management support is needed unless otherwise documented below in the visit note. 

## 2014-12-03 LAB — URINE CULTURE
Colony Count: NO GROWTH
ORGANISM ID, BACTERIA: NO GROWTH

## 2014-12-05 NOTE — Assessment & Plan Note (Signed)
Also for flomax trial, f/u with urology as planned

## 2014-12-05 NOTE — Assessment & Plan Note (Signed)
C/w rls - for mirapex qhs prn,  to f/u any worsening symptoms or concerns

## 2014-12-05 NOTE — Assessment & Plan Note (Signed)
stable overall by history and exam, recent data reviewed with pt, and pt to continue medical treatment as before,  to f/u any worsening symptoms or concerns BP Readings from Last 3 Encounters:  12/01/14 132/82  08/24/14 120/80  02/11/14 120/80

## 2014-12-05 NOTE — Assessment & Plan Note (Signed)
stable overall by history and exam, recent data reviewed with pt, and pt to continue medical treatment as before,  to f/u any worsening symptoms or concerns Lab Results  Component Value Date   HGBA1C 6.6* 12/01/2014

## 2014-12-05 NOTE — Assessment & Plan Note (Signed)
?   [facility] - for urine studies, may need antibx pending results,  to f/u any worsening symptoms or concerns

## 2015-01-07 DIAGNOSIS — R972 Elevated prostate specific antigen [PSA]: Secondary | ICD-10-CM | POA: Diagnosis not present

## 2015-01-10 ENCOUNTER — Emergency Department (HOSPITAL_COMMUNITY)
Admission: EM | Admit: 2015-01-10 | Discharge: 2015-01-10 | Disposition: A | Discharge status: Home / Self Care | Payer: No Typology Code available for payment source | Attending: Emergency Medicine | Admitting: Emergency Medicine

## 2015-01-10 ENCOUNTER — Encounter (HOSPITAL_COMMUNITY): Payer: Self-pay | Admitting: Emergency Medicine

## 2015-01-10 DIAGNOSIS — Y998 Other external cause status: Secondary | ICD-10-CM | POA: Diagnosis not present

## 2015-01-10 DIAGNOSIS — Z79899 Other long term (current) drug therapy: Secondary | ICD-10-CM | POA: Insufficient documentation

## 2015-01-10 DIAGNOSIS — F329 Major depressive disorder, single episode, unspecified: Secondary | ICD-10-CM | POA: Diagnosis not present

## 2015-01-10 DIAGNOSIS — Z720 Tobacco use: Secondary | ICD-10-CM | POA: Diagnosis not present

## 2015-01-10 DIAGNOSIS — E785 Hyperlipidemia, unspecified: Secondary | ICD-10-CM | POA: Diagnosis not present

## 2015-01-10 DIAGNOSIS — Y9389 Activity, other specified: Secondary | ICD-10-CM | POA: Diagnosis not present

## 2015-01-10 DIAGNOSIS — N400 Enlarged prostate without lower urinary tract symptoms: Secondary | ICD-10-CM | POA: Diagnosis not present

## 2015-01-10 DIAGNOSIS — Z7982 Long term (current) use of aspirin: Secondary | ICD-10-CM | POA: Insufficient documentation

## 2015-01-10 DIAGNOSIS — Y92410 Unspecified street and highway as the place of occurrence of the external cause: Secondary | ICD-10-CM | POA: Diagnosis not present

## 2015-01-10 DIAGNOSIS — Z041 Encounter for examination and observation following transport accident: Secondary | ICD-10-CM | POA: Insufficient documentation

## 2015-01-10 DIAGNOSIS — I10 Essential (primary) hypertension: Secondary | ICD-10-CM | POA: Diagnosis not present

## 2015-01-10 DIAGNOSIS — E119 Type 2 diabetes mellitus without complications: Secondary | ICD-10-CM | POA: Insufficient documentation

## 2015-01-10 DIAGNOSIS — K219 Gastro-esophageal reflux disease without esophagitis: Secondary | ICD-10-CM | POA: Insufficient documentation

## 2015-01-10 DIAGNOSIS — F200 Paranoid schizophrenia: Secondary | ICD-10-CM | POA: Diagnosis not present

## 2015-01-10 LAB — COMPREHENSIVE METABOLIC PANEL
ALBUMIN: 4.1 g/dL (ref 3.5–5.2)
ALT: 19 U/L (ref 0–53)
AST: 23 U/L (ref 0–37)
Alkaline Phosphatase: 70 U/L (ref 39–117)
Anion gap: 7 (ref 5–15)
BILIRUBIN TOTAL: 0.5 mg/dL (ref 0.3–1.2)
BUN: 10 mg/dL (ref 6–23)
CALCIUM: 8.8 mg/dL (ref 8.4–10.5)
CO2: 27 mmol/L (ref 19–32)
Chloride: 106 mmol/L (ref 96–112)
Creatinine, Ser: 1.53 mg/dL — ABNORMAL HIGH (ref 0.50–1.35)
GFR calc Af Amer: 53 mL/min — ABNORMAL LOW (ref >90)
GFR, EST NON AFRICAN AMERICAN: 46 mL/min — AB (ref >90)
Glucose, Bld: 106 mg/dL — ABNORMAL HIGH (ref 70–99)
Potassium: 4 mmol/L (ref 3.5–5.1)
Sodium: 140 mmol/L (ref 135–145)
Total Protein: 7.6 g/dL (ref 6.0–8.3)

## 2015-01-10 LAB — CBC WITH DIFFERENTIAL/PLATELET
Basophils Absolute: 0 10*3/uL (ref 0.0–0.1)
Basophils Relative: 1 % (ref 0–1)
EOS PCT: 2 % (ref 0–5)
Eosinophils Absolute: 0.1 10*3/uL (ref 0.0–0.7)
HEMATOCRIT: 42.2 % (ref 39.0–52.0)
HEMOGLOBIN: 14.4 g/dL (ref 13.0–17.0)
Lymphocytes Relative: 41 % (ref 12–46)
Lymphs Abs: 2.3 10*3/uL (ref 0.7–4.0)
MCH: 29.7 pg (ref 26.0–34.0)
MCHC: 34.1 g/dL (ref 30.0–36.0)
MCV: 87 fL (ref 78.0–100.0)
MONO ABS: 0.6 10*3/uL (ref 0.1–1.0)
Monocytes Relative: 11 % (ref 3–12)
Neutro Abs: 2.6 10*3/uL (ref 1.7–7.7)
Neutrophils Relative %: 45 % (ref 43–77)
PLATELETS: 305 10*3/uL (ref 150–400)
RBC: 4.85 MIL/uL (ref 4.22–5.81)
RDW: 16.9 % — ABNORMAL HIGH (ref 11.5–15.5)
WBC: 5.7 10*3/uL (ref 4.0–10.5)

## 2015-01-10 LAB — LIPASE, BLOOD: LIPASE: 37 U/L (ref 11–59)

## 2015-01-10 MED ORDER — ACETAMINOPHEN 325 MG PO TABS: 650.0000 mg | ORAL_TABLET | Freq: Once | ORAL | Status: DC

## 2015-01-10 MED ORDER — HYDROCODONE-ACETAMINOPHEN 5-325 MG PO TABS: 1.0000 | ORAL_TABLET | Freq: Four times a day (QID) | ORAL | Status: DC | PRN

## 2015-01-10 MED ORDER — METHOCARBAMOL 500 MG PO TABS: 500.0000 mg | ORAL_TABLET | Freq: Three times a day (TID) | ORAL | Status: DC | PRN
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
## 2015-01-10 NOTE — Discharge Instructions (Signed)

## 2015-01-14 DIAGNOSIS — R972 Elevated prostate specific antigen [PSA]: Secondary | ICD-10-CM | POA: Diagnosis not present

## 2015-01-14 DIAGNOSIS — N4889 Other specified disorders of penis: Secondary | ICD-10-CM | POA: Diagnosis not present

## 2015-02-02 ENCOUNTER — Ambulatory Visit (INDEPENDENT_AMBULATORY_CARE_PROVIDER_SITE_OTHER): Payer: Medicare Other | Admitting: Internal Medicine

## 2015-02-02 ENCOUNTER — Encounter: Payer: Self-pay | Admitting: Internal Medicine

## 2015-02-02 VITALS — BP 120/72 | HR 63 | Temp 98.2°F | Resp 18 | Ht 69.0 in | Wt 189.1 lb

## 2015-02-02 DIAGNOSIS — E119 Type 2 diabetes mellitus without complications: Secondary | ICD-10-CM | POA: Diagnosis not present

## 2015-02-02 DIAGNOSIS — E785 Hyperlipidemia, unspecified: Secondary | ICD-10-CM | POA: Diagnosis not present

## 2015-02-02 DIAGNOSIS — R21 Rash and other nonspecific skin eruption: Secondary | ICD-10-CM

## 2015-02-02 DIAGNOSIS — I10 Essential (primary) hypertension: Secondary | ICD-10-CM | POA: Diagnosis not present

## 2015-02-02 MED ORDER — DOXYCYCLINE HYCLATE 100 MG PO TABS: 100.0000 mg | ORAL_TABLET | Freq: Two times a day (BID) | ORAL | Status: DC
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
## 2015-02-02 NOTE — Patient Instructions (Signed)
Please take all new medication as prescribed  Please continue all other medications as before, and refills have been done if requested.  Please have the pharmacy call with any other refills you may need.  Please keep your appointments with your specialists as you may have planned     

## 2015-02-02 NOTE — Progress Notes (Signed)
Pre visit review using our clinic review tool, if applicable. No additional management support is needed unless otherwise documented below in the visit note. 

## 2015-02-03 ENCOUNTER — Telehealth: Payer: Self-pay | Admitting: Internal Medicine

## 2015-02-03 NOTE — Telephone Encounter (Signed)
emmi emailed °

## 2015-02-11 ENCOUNTER — Ambulatory Visit (INDEPENDENT_AMBULATORY_CARE_PROVIDER_SITE_OTHER): Payer: Medicare Other | Admitting: Internal Medicine

## 2015-02-11 ENCOUNTER — Other Ambulatory Visit (INDEPENDENT_AMBULATORY_CARE_PROVIDER_SITE_OTHER): Payer: Medicare Other

## 2015-02-11 ENCOUNTER — Encounter: Payer: Self-pay | Admitting: Internal Medicine

## 2015-02-11 VITALS — BP 138/78 | HR 59 | Temp 97.8°F | Ht 69.0 in | Wt 192.0 lb

## 2015-02-11 DIAGNOSIS — Z0000 Encounter for general adult medical examination without abnormal findings: Secondary | ICD-10-CM

## 2015-02-11 DIAGNOSIS — E119 Type 2 diabetes mellitus without complications: Secondary | ICD-10-CM

## 2015-02-11 DIAGNOSIS — I10 Essential (primary) hypertension: Secondary | ICD-10-CM

## 2015-02-11 DIAGNOSIS — R21 Rash and other nonspecific skin eruption: Secondary | ICD-10-CM | POA: Insufficient documentation

## 2015-02-11 DIAGNOSIS — E785 Hyperlipidemia, unspecified: Secondary | ICD-10-CM | POA: Diagnosis not present

## 2015-02-11 LAB — CBC WITH DIFFERENTIAL/PLATELET
BASOS ABS: 0 10*3/uL (ref 0.0–0.1)
BASOS PCT: 0.3 % (ref 0.0–3.0)
Eosinophils Absolute: 0.1 10*3/uL (ref 0.0–0.7)
Eosinophils Relative: 1 % (ref 0.0–5.0)
HCT: 44.6 % (ref 39.0–52.0)
HEMOGLOBIN: 14.8 g/dL (ref 13.0–17.0)
LYMPHS ABS: 3.8 10*3/uL (ref 0.7–4.0)
Lymphocytes Relative: 46.6 % — ABNORMAL HIGH (ref 12.0–46.0)
MCHC: 33.2 g/dL (ref 30.0–36.0)
MCV: 87.7 fl (ref 78.0–100.0)
Monocytes Absolute: 0.9 10*3/uL (ref 0.1–1.0)
Monocytes Relative: 11.5 % (ref 3.0–12.0)
NEUTROS ABS: 3.3 10*3/uL (ref 1.4–7.7)
Neutrophils Relative %: 40.6 % — ABNORMAL LOW (ref 43.0–77.0)
Platelets: 339 10*3/uL (ref 150.0–400.0)
RBC: 5.09 Mil/uL (ref 4.22–5.81)
RDW: 17.7 % — AB (ref 11.5–15.5)
WBC: 8.2 10*3/uL (ref 4.0–10.5)

## 2015-02-11 LAB — HEPATIC FUNCTION PANEL
ALT: 18 U/L (ref 0–53)
AST: 23 U/L (ref 0–37)
Albumin: 4.2 g/dL (ref 3.5–5.2)
Alkaline Phosphatase: 81 U/L (ref 39–117)
BILIRUBIN TOTAL: 0.6 mg/dL (ref 0.2–1.2)
Bilirubin, Direct: 0.1 mg/dL (ref 0.0–0.3)
TOTAL PROTEIN: 7.8 g/dL (ref 6.0–8.3)

## 2015-02-11 LAB — BASIC METABOLIC PANEL
BUN: 12 mg/dL (ref 6–23)
CHLORIDE: 105 meq/L (ref 96–112)
CO2: 31 meq/L (ref 19–32)
Calcium: 9.5 mg/dL (ref 8.4–10.5)
Creatinine, Ser: 1.44 mg/dL (ref 0.40–1.50)
GFR: 63.25 mL/min (ref >60.00)
Glucose, Bld: 93 mg/dL (ref 70–99)
Potassium: 4.6 mEq/L (ref 3.5–5.1)
SODIUM: 139 meq/L (ref 135–145)

## 2015-02-11 LAB — URINALYSIS, ROUTINE W REFLEX MICROSCOPIC
Bilirubin Urine: NEGATIVE
Hgb urine dipstick: NEGATIVE
KETONES UR: NEGATIVE
LEUKOCYTES UA: NEGATIVE
NITRITE: NEGATIVE
RBC / HPF: NONE SEEN (ref 0–?)
SPECIFIC GRAVITY, URINE: 1.01 (ref 1.000–1.030)
Total Protein, Urine: NEGATIVE
Urine Glucose: NEGATIVE
Urobilinogen, UA: 0.2 (ref 0.0–1.0)
pH: 6 (ref 5.0–8.0)

## 2015-02-11 LAB — HEMOGLOBIN A1C: Hgb A1c MFr Bld: 6.4 % (ref 4.6–6.5)

## 2015-02-11 LAB — LIPID PANEL
CHOLESTEROL: 132 mg/dL (ref 0–200)
HDL: 30.5 mg/dL — ABNORMAL LOW (ref >39.00)
LDL Cholesterol: 73 mg/dL (ref 0–99)
NONHDL: 101.5
Total CHOL/HDL Ratio: 4
Triglycerides: 142 mg/dL (ref 0.0–149.0)
VLDL: 28.4 mg/dL (ref 0.0–40.0)

## 2015-02-11 LAB — MICROALBUMIN / CREATININE URINE RATIO
CREATININE, U: 97 mg/dL
Microalb Creat Ratio: 0.7 mg/g (ref 0.0–30.0)
Microalb, Ur: <0.7 mg/dL (ref 0.0–1.9)

## 2015-02-11 LAB — TSH: TSH: 1.52 u[IU]/mL (ref 0.35–4.50)

## 2015-02-11 NOTE — Assessment & Plan Note (Signed)
Right groin area - c/w prob cellulitis early - for doxy course,  to f/u any worsening symptoms or concerns

## 2015-02-11 NOTE — Progress Notes (Signed)
Pre visit review using our clinic review tool, if applicable. No additional management support is needed unless otherwise documented below in the visit note. 

## 2015-02-11 NOTE — Patient Instructions (Signed)
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
## 2015-02-11 NOTE — Assessment & Plan Note (Signed)

## 2015-02-11 NOTE — Assessment & Plan Note (Signed)
stable overall by history and exam, recent data reviewed with pt, and pt to continue medical treatment as before,  to f/u any worsening symptoms or concerns Lab Results  Component Value Date   HGBA1C 6.6* 12/01/2014

## 2015-02-11 NOTE — Assessment & Plan Note (Signed)
stable overall by history and exam, recent data reviewed with pt, and pt to continue medical treatment as before,  to f/u any worsening symptoms or concerns Lab Results  Component Value Date   LDLCALC 89 12/01/2014

## 2015-02-11 NOTE — Assessment & Plan Note (Signed)
stable overall by history and exam, recent data reviewed with pt, and pt to continue medical treatment as before,  to f/u any worsening symptoms or concerns BP Readings from Last 3 Encounters:  02/02/15 120/72  01/10/15 118/84  12/01/14 132/82

## 2015-02-23 ENCOUNTER — Ambulatory Visit: Payer: Medicare Other | Admitting: Internal Medicine

## 2015-03-02 ENCOUNTER — Ambulatory Visit: Payer: Medicare Other | Admitting: Internal Medicine

## 2015-03-03 DIAGNOSIS — N4889 Other specified disorders of penis: Secondary | ICD-10-CM | POA: Diagnosis not present

## 2015-03-31 ENCOUNTER — Other Ambulatory Visit: Payer: Self-pay

## 2015-03-31 MED ORDER — AMLODIPINE BESYLATE 5 MG PO TABS: 5.0000 mg | ORAL_TABLET | Freq: Every day | ORAL | Status: DC

## 2015-04-05 DIAGNOSIS — N4889 Other specified disorders of penis: Secondary | ICD-10-CM | POA: Diagnosis not present

## 2015-04-13 ENCOUNTER — Other Ambulatory Visit: Payer: Self-pay | Admitting: Internal Medicine

## 2015-05-03 DIAGNOSIS — N4889 Other specified disorders of penis: Secondary | ICD-10-CM | POA: Diagnosis not present

## 2015-08-17 ENCOUNTER — Ambulatory Visit (INDEPENDENT_AMBULATORY_CARE_PROVIDER_SITE_OTHER): Payer: Medicare Other | Admitting: Internal Medicine

## 2015-08-17 ENCOUNTER — Encounter: Payer: Self-pay | Admitting: Internal Medicine

## 2015-08-17 ENCOUNTER — Other Ambulatory Visit (INDEPENDENT_AMBULATORY_CARE_PROVIDER_SITE_OTHER): Payer: Medicare Other

## 2015-08-17 VITALS — BP 112/70 | HR 85 | Temp 97.8°F | Ht 69.0 in | Wt 187.0 lb

## 2015-08-17 DIAGNOSIS — Z0000 Encounter for general adult medical examination without abnormal findings: Secondary | ICD-10-CM

## 2015-08-17 DIAGNOSIS — H6981 Other specified disorders of Eustachian tube, right ear: Secondary | ICD-10-CM

## 2015-08-17 DIAGNOSIS — Z0189 Encounter for other specified special examinations: Secondary | ICD-10-CM

## 2015-08-17 DIAGNOSIS — H6980 Other specified disorders of Eustachian tube, unspecified ear: Secondary | ICD-10-CM | POA: Insufficient documentation

## 2015-08-17 DIAGNOSIS — E785 Hyperlipidemia, unspecified: Secondary | ICD-10-CM | POA: Diagnosis not present

## 2015-08-17 DIAGNOSIS — R109 Unspecified abdominal pain: Secondary | ICD-10-CM

## 2015-08-17 DIAGNOSIS — I10 Essential (primary) hypertension: Secondary | ICD-10-CM

## 2015-08-17 DIAGNOSIS — E119 Type 2 diabetes mellitus without complications: Secondary | ICD-10-CM | POA: Diagnosis not present

## 2015-08-17 DIAGNOSIS — H6991 Unspecified Eustachian tube disorder, right ear: Secondary | ICD-10-CM

## 2015-08-17 LAB — CBC WITH DIFFERENTIAL/PLATELET
Basophils Absolute: 0.1 10*3/uL (ref 0.0–0.1)
Basophils Relative: 0.9 % (ref 0.0–3.0)
EOS PCT: 1.2 % (ref 0.0–5.0)
Eosinophils Absolute: 0.1 10*3/uL (ref 0.0–0.7)
HCT: 45.6 % (ref 39.0–52.0)
HEMOGLOBIN: 15.2 g/dL (ref 13.0–17.0)
LYMPHS PCT: 33.2 % (ref 12.0–46.0)
Lymphs Abs: 2.8 10*3/uL (ref 0.7–4.0)
MCHC: 33.3 g/dL (ref 30.0–36.0)
MCV: 87.5 fl (ref 78.0–100.0)
MONO ABS: 0.6 10*3/uL (ref 0.1–1.0)
Monocytes Relative: 7.4 % (ref 3.0–12.0)
Neutro Abs: 4.8 10*3/uL (ref 1.4–7.7)
Neutrophils Relative %: 57.3 % (ref 43.0–77.0)
Platelets: 371 10*3/uL (ref 150.0–400.0)
RBC: 5.22 Mil/uL (ref 4.22–5.81)
RDW: 16.8 % — ABNORMAL HIGH (ref 11.5–15.5)
WBC: 8.3 10*3/uL (ref 4.0–10.5)

## 2015-08-17 LAB — URINALYSIS, ROUTINE W REFLEX MICROSCOPIC
Hgb urine dipstick: NEGATIVE
LEUKOCYTES UA: NEGATIVE
NITRITE: NEGATIVE
SPECIFIC GRAVITY, URINE: 1.025 (ref 1.000–1.030)
Total Protein, Urine: NEGATIVE
URINE GLUCOSE: NEGATIVE
Urobilinogen, UA: 0.2 (ref 0.0–1.0)
pH: 5.5 (ref 5.0–8.0)

## 2015-08-17 LAB — BASIC METABOLIC PANEL
BUN: 9 mg/dL (ref 6–23)
CO2: 30 mEq/L (ref 19–32)
CREATININE: 1.59 mg/dL — AB (ref 0.40–1.50)
Calcium: 9.5 mg/dL (ref 8.4–10.5)
Chloride: 102 mEq/L (ref 96–112)
GFR: 56.33 mL/min — ABNORMAL LOW (ref >60.00)
GLUCOSE: 107 mg/dL — AB (ref 70–99)
Potassium: 4.2 mEq/L (ref 3.5–5.1)
Sodium: 138 mEq/L (ref 135–145)

## 2015-08-17 LAB — LIPASE: Lipase: 51 U/L (ref 11.0–59.0)

## 2015-08-17 LAB — HEPATIC FUNCTION PANEL
ALBUMIN: 4.2 g/dL (ref 3.5–5.2)
ALT: 26 U/L (ref 0–53)
AST: 24 U/L (ref 0–37)
Alkaline Phosphatase: 76 U/L (ref 39–117)
Bilirubin, Direct: 0.2 mg/dL (ref 0.0–0.3)
Total Bilirubin: 0.7 mg/dL (ref 0.2–1.2)
Total Protein: 8 g/dL (ref 6.0–8.3)

## 2015-08-17 LAB — HEMOGLOBIN A1C: Hgb A1c MFr Bld: 6.4 % (ref 4.6–6.5)

## 2015-08-17 LAB — LIPID PANEL
Cholesterol: 142 mg/dL (ref 0–200)
HDL: 31.4 mg/dL — ABNORMAL LOW (ref >39.00)
LDL CALC: 76 mg/dL (ref 0–99)
NonHDL: 110.36
TRIGLYCERIDES: 172 mg/dL — AB (ref 0.0–149.0)
Total CHOL/HDL Ratio: 5
VLDL: 34.4 mg/dL (ref 0.0–40.0)

## 2015-08-17 MED ORDER — CETIRIZINE HCL 10 MG PO TABS: 10.0000 mg | ORAL_TABLET | Freq: Every day | ORAL | Status: DC

## 2015-08-17 NOTE — Assessment & Plan Note (Signed)
stable overall by history and exam, recent data reviewed with pt, and pt to continue medical treatment as before,  to f/u any worsening symptoms or concerns Lab Results  Component Value Date   LDLCALC 73 02/11/2015

## 2015-08-17 NOTE — Assessment & Plan Note (Signed)
Likely constipation related, for lipase, UA , exam benign, ok for miralax asd

## 2015-08-17 NOTE — Patient Instructions (Signed)
Please take all new medication as prescribed - the zyrtec   You can also take Delsym OTC for cough, and/or Mucinex (or it's generic off brand) for congestion, and tylenol as needed for pain.  You can also use Miralax 17 gm by mouth every day for constipation  Please continue all other medications as before, and refills have been done if requested.  Please have the pharmacy call with any other refills you may need.  Please continue your efforts at being more active, low cholesterol diet, and weight control.  Please keep your appointments with your specialists as you may have planned  Please go to the LAB in the Basement (turn left off the elevator) for the tests to be done today  You will be contacted by phone if any changes need to be made immediately.  Otherwise, you will receive a letter about your results with an explanation, but please check with MyChart first.  Please remember to sign up for MyChart if you have not done so, as this will be important to you in the future with finding out test results, communicating by private email, and scheduling acute appointments online when needed.  Please return in 6 months, or sooner if needed, with Lab testing done 3-5 days before

## 2015-08-17 NOTE — Assessment & Plan Note (Signed)
stable overall by history and exam, recent data reviewed with pt, and pt to continue medical treatment as before,  to f/u any worsening symptoms or concerns Lab Results  Component Value Date   HGBA1C 6.4 02/11/2015    for f/u a1c
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
## 2015-08-17 NOTE — Assessment & Plan Note (Signed)
stable overall by history and exam, recent data reviewed with pt, and pt to continue medical treatment as before,  to f/u any worsening symptoms or concerns BP Readings from Last 3 Encounters:  08/17/15 112/70  02/11/15 138/78  02/02/15 120/72

## 2015-08-17 NOTE — Progress Notes (Signed)
Pre visit review using our clinic review tool, if applicable. No additional management support is needed unless otherwise documented below in the visit note. 

## 2015-08-17 NOTE — Assessment & Plan Note (Signed)
Ok for mucinex otc prn,  to f/u any worsening symptoms or concerns 

## 2015-08-18 ENCOUNTER — Ambulatory Visit: Payer: Medicare Other | Admitting: Internal Medicine

## 2015-09-08 ENCOUNTER — Other Ambulatory Visit: Payer: Self-pay | Admitting: Internal Medicine

## 2015-11-11 ENCOUNTER — Encounter: Payer: Self-pay | Admitting: Internal Medicine

## 2015-12-27 DIAGNOSIS — N4889 Other specified disorders of penis: Secondary | ICD-10-CM | POA: Diagnosis not present

## 2015-12-27 DIAGNOSIS — K649 Unspecified hemorrhoids: Secondary | ICD-10-CM | POA: Diagnosis not present

## 2016-01-13 ENCOUNTER — Telehealth: Payer: Self-pay | Admitting: Internal Medicine

## 2016-01-13 ENCOUNTER — Other Ambulatory Visit: Payer: Self-pay

## 2016-01-13 MED ORDER — PRAMIPEXOLE DIHYDROCHLORIDE 0.25 MG PO TABS: 0.2500 mg | ORAL_TABLET | Freq: Every day | ORAL | Status: DC

## 2016-01-13 NOTE — Telephone Encounter (Signed)
Pt request to refill for pramipexole (MIRAPEX) 0.25 MG tablet to be send to [facility]. Please help, he is going to be out.

## 2016-01-16 ENCOUNTER — Other Ambulatory Visit: Payer: Self-pay | Admitting: Internal Medicine

## 2016-02-16 ENCOUNTER — Other Ambulatory Visit: Payer: Self-pay | Admitting: Internal Medicine

## 2016-02-16 ENCOUNTER — Encounter: Payer: Self-pay | Admitting: Internal Medicine

## 2016-02-16 ENCOUNTER — Ambulatory Visit (INDEPENDENT_AMBULATORY_CARE_PROVIDER_SITE_OTHER): Payer: Medicare Other | Admitting: Internal Medicine

## 2016-02-16 ENCOUNTER — Other Ambulatory Visit (INDEPENDENT_AMBULATORY_CARE_PROVIDER_SITE_OTHER): Payer: Medicare Other

## 2016-02-16 VITALS — BP 110/70 | HR 70 | Temp 97.9°F | Resp 20 | Wt 196.0 lb

## 2016-02-16 DIAGNOSIS — E119 Type 2 diabetes mellitus without complications: Secondary | ICD-10-CM | POA: Diagnosis not present

## 2016-02-16 DIAGNOSIS — E785 Hyperlipidemia, unspecified: Secondary | ICD-10-CM

## 2016-02-16 DIAGNOSIS — I10 Essential (primary) hypertension: Secondary | ICD-10-CM

## 2016-02-16 DIAGNOSIS — Z0000 Encounter for general adult medical examination without abnormal findings: Secondary | ICD-10-CM | POA: Diagnosis not present

## 2016-02-16 DIAGNOSIS — R972 Elevated prostate specific antigen [PSA]: Secondary | ICD-10-CM

## 2016-02-16 LAB — MICROALBUMIN / CREATININE URINE RATIO
CREATININE, U: 194.1 mg/dL
Microalb Creat Ratio: 0.4 mg/g (ref 0.0–30.0)

## 2016-02-16 LAB — HEPATIC FUNCTION PANEL
ALBUMIN: 4.4 g/dL (ref 3.5–5.2)
ALK PHOS: 72 U/L (ref 39–117)
ALT: 26 U/L (ref 0–53)
AST: 24 U/L (ref 0–37)
BILIRUBIN TOTAL: 0.7 mg/dL (ref 0.2–1.2)
Bilirubin, Direct: 0.1 mg/dL (ref 0.0–0.3)
Total Protein: 7.6 g/dL (ref 6.0–8.3)

## 2016-02-16 LAB — BASIC METABOLIC PANEL
BUN: 8 mg/dL (ref 6–23)
CALCIUM: 9.3 mg/dL (ref 8.4–10.5)
CO2: 29 mEq/L (ref 19–32)
Chloride: 104 mEq/L (ref 96–112)
Creatinine, Ser: 1.48 mg/dL (ref 0.40–1.50)
GFR: 61.09 mL/min (ref >60.00)
GLUCOSE: 94 mg/dL (ref 70–99)
Potassium: 4.5 mEq/L (ref 3.5–5.1)
SODIUM: 139 meq/L (ref 135–145)

## 2016-02-16 LAB — LIPID PANEL
CHOL/HDL RATIO: 4
Cholesterol: 142 mg/dL (ref 0–200)
HDL: 33.5 mg/dL — AB (ref >39.00)
LDL CALC: 88 mg/dL (ref 0–99)
NonHDL: 108.97
TRIGLYCERIDES: 105 mg/dL (ref 0.0–149.0)
VLDL: 21 mg/dL (ref 0.0–40.0)

## 2016-02-16 LAB — CBC WITH DIFFERENTIAL/PLATELET
BASOS PCT: 1.7 % (ref 0.0–3.0)
Basophils Absolute: 0.1 10*3/uL (ref 0.0–0.1)
Eosinophils Absolute: 0.1 10*3/uL (ref 0.0–0.7)
Eosinophils Relative: 0.7 % (ref 0.0–5.0)
HCT: 45.6 % (ref 39.0–52.0)
HEMOGLOBIN: 15.2 g/dL (ref 13.0–17.0)
Lymphocytes Relative: 33.5 % (ref 12.0–46.0)
Lymphs Abs: 2.5 10*3/uL (ref 0.7–4.0)
MCHC: 33.3 g/dL (ref 30.0–36.0)
MCV: 87.7 fl (ref 78.0–100.0)
MONOS PCT: 10.5 % (ref 3.0–12.0)
Monocytes Absolute: 0.8 10*3/uL (ref 0.1–1.0)
Neutro Abs: 4 10*3/uL (ref 1.4–7.7)
Neutrophils Relative %: 53.6 % (ref 43.0–77.0)
Platelets: 340 10*3/uL (ref 150.0–400.0)
RBC: 5.2 Mil/uL (ref 4.22–5.81)
RDW: 17.3 % — ABNORMAL HIGH (ref 11.5–15.5)
WBC: 7.4 10*3/uL (ref 4.0–10.5)

## 2016-02-16 LAB — PSA: PSA: 8.27 ng/mL — AB (ref 0.10–4.00)

## 2016-02-16 LAB — TSH: TSH: 1.16 u[IU]/mL (ref 0.35–4.50)

## 2016-02-16 LAB — URINALYSIS, ROUTINE W REFLEX MICROSCOPIC
BILIRUBIN URINE: NEGATIVE
HGB URINE DIPSTICK: NEGATIVE
Ketones, ur: NEGATIVE
LEUKOCYTES UA: NEGATIVE
NITRITE: NEGATIVE
RBC / HPF: NONE SEEN (ref 0–?)
Specific Gravity, Urine: 1.025 (ref 1.000–1.030)
Total Protein, Urine: NEGATIVE
Urine Glucose: NEGATIVE
Urobilinogen, UA: 0.2 (ref 0.0–1.0)
pH: 5.5 (ref 5.0–8.0)

## 2016-02-16 LAB — HEMOGLOBIN A1C: HEMOGLOBIN A1C: 6.7 % — AB (ref 4.6–6.5)

## 2016-02-16 NOTE — Patient Instructions (Signed)
You had the Prevnar 13 pneumonia shot today  Please continue all other medications as before, and refills have been done if requested.  Please have the pharmacy call with any other refills you may need.  Please continue your efforts at being more active, low cholesterol diet, and weight control.  You are otherwise up to date with prevention measures today.  Please keep your appointments with your specialists as you may have planned  Please go to the LAB in the Basement (turn left off the elevator) for the tests to be done today  You will be contacted by phone if any changes need to be made immediately.  Otherwise, you will receive a letter about your results with an explanation, but please check with MyChart first.  Please remember to sign up for MyChart if you have not done so, as this will be important to you in the future with finding out test results, communicating by private email, and scheduling acute appointments online when needed.  Please return in 6 months, or sooner if needed 

## 2016-02-16 NOTE — Progress Notes (Signed)
Pre visit review using our clinic review tool, if applicable. No additional management support is needed unless otherwise documented below in the visit note. 
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
## 2016-02-18 NOTE — Assessment & Plan Note (Signed)
stable overall by history and exam, recent data reviewed with pt, and pt to continue medical treatment as before,  to f/u any worsening symptoms or concerns Lab Results  Component Value Date   LDLCALC 88 02/16/2016

## 2016-02-18 NOTE — Assessment & Plan Note (Signed)
stable overall by history and exam, recent data reviewed with pt, and pt to continue medical treatment as before,  to f/u any worsening symptoms or concerns Lab Results  Component Value Date   HGBA1C 6.7* 02/16/2016

## 2016-02-18 NOTE — Assessment & Plan Note (Signed)
stable overall by history and exam, recent data reviewed with pt, and pt to continue medical treatment as before,  to f/u any worsening symptoms or concerns BP Readings from Last 3 Encounters:  02/16/16 110/70  08/17/15 112/70  02/11/15 138/78

## 2016-02-18 NOTE — Assessment & Plan Note (Signed)

## 2016-02-25 ENCOUNTER — Other Ambulatory Visit: Payer: Self-pay | Admitting: Internal Medicine

## 2016-03-05 ENCOUNTER — Telehealth: Payer: Self-pay | Admitting: Internal Medicine

## 2016-03-05 NOTE — Telephone Encounter (Signed)
Please follow up on labs

## 2016-03-12 ENCOUNTER — Other Ambulatory Visit: Payer: Self-pay | Admitting: Internal Medicine

## 2016-04-10 ENCOUNTER — Other Ambulatory Visit: Payer: Self-pay | Admitting: Internal Medicine

## 2016-06-17 ENCOUNTER — Other Ambulatory Visit: Payer: Self-pay | Admitting: Internal Medicine

## 2016-07-12 ENCOUNTER — Other Ambulatory Visit: Payer: Self-pay | Admitting: Internal Medicine

## 2016-08-17 ENCOUNTER — Ambulatory Visit: Payer: Medicare Other | Admitting: Internal Medicine

## 2016-08-23 ENCOUNTER — Ambulatory Visit (INDEPENDENT_AMBULATORY_CARE_PROVIDER_SITE_OTHER): Payer: Medicare Other | Admitting: Internal Medicine

## 2016-08-23 ENCOUNTER — Other Ambulatory Visit (INDEPENDENT_AMBULATORY_CARE_PROVIDER_SITE_OTHER): Payer: Medicare Other

## 2016-08-23 ENCOUNTER — Encounter: Payer: Self-pay | Admitting: Internal Medicine

## 2016-08-23 VITALS — BP 140/80 | HR 89 | Temp 98.3°F | Resp 20 | Wt 193.0 lb

## 2016-08-23 DIAGNOSIS — R972 Elevated prostate specific antigen [PSA]: Secondary | ICD-10-CM

## 2016-08-23 DIAGNOSIS — E785 Hyperlipidemia, unspecified: Secondary | ICD-10-CM

## 2016-08-23 DIAGNOSIS — I10 Essential (primary) hypertension: Secondary | ICD-10-CM | POA: Diagnosis not present

## 2016-08-23 DIAGNOSIS — E119 Type 2 diabetes mellitus without complications: Secondary | ICD-10-CM | POA: Diagnosis not present

## 2016-08-23 DIAGNOSIS — R6889 Other general symptoms and signs: Secondary | ICD-10-CM

## 2016-08-23 DIAGNOSIS — Z0001 Encounter for general adult medical examination with abnormal findings: Secondary | ICD-10-CM

## 2016-08-23 LAB — HEPATIC FUNCTION PANEL
ALBUMIN: 4.2 g/dL (ref 3.5–5.2)
ALT: 20 U/L (ref 0–53)
AST: 22 U/L (ref 0–37)
Alkaline Phosphatase: 92 U/L (ref 39–117)
BILIRUBIN DIRECT: 0.1 mg/dL (ref 0.0–0.3)
TOTAL PROTEIN: 7.5 g/dL (ref 6.0–8.3)
Total Bilirubin: 0.6 mg/dL (ref 0.2–1.2)

## 2016-08-23 LAB — BASIC METABOLIC PANEL
BUN: 8 mg/dL (ref 6–23)
CALCIUM: 9.1 mg/dL (ref 8.4–10.5)
CO2: 32 meq/L (ref 19–32)
CREATININE: 1.42 mg/dL (ref 0.40–1.50)
Chloride: 105 mEq/L (ref 96–112)
GFR: 63.98 mL/min (ref >60.00)
Glucose, Bld: 70 mg/dL (ref 70–99)
Potassium: 4.8 mEq/L (ref 3.5–5.1)
Sodium: 139 mEq/L (ref 135–145)

## 2016-08-23 LAB — LIPID PANEL
CHOL/HDL RATIO: 4
Cholesterol: 148 mg/dL (ref 0–200)
HDL: 35.5 mg/dL — AB (ref >39.00)
LDL Cholesterol: 85 mg/dL (ref 0–99)
NONHDL: 112.43
TRIGLYCERIDES: 139 mg/dL (ref 0.0–149.0)
VLDL: 27.8 mg/dL (ref 0.0–40.0)

## 2016-08-23 LAB — PSA: PSA: 5.12 ng/mL — ABNORMAL HIGH (ref 0.10–4.00)

## 2016-08-23 LAB — HEMOGLOBIN A1C: HEMOGLOBIN A1C: 6.3 % (ref 4.6–6.5)

## 2016-08-23 NOTE — Progress Notes (Signed)
Pre visit review using our clinic review tool, if applicable. No additional management support is needed unless otherwise documented below in the visit note. 
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
## 2016-08-23 NOTE — Patient Instructions (Signed)
Rememeber, your PSA for prostate has been high recently  You will be contacted regarding the referral for: Dr Lupita Leash at [facility]  Please continue all other medications as before, and refills have been done if requested.  Please have the pharmacy call with any other refills you may need.  Please continue your efforts at being more active, low cholesterol diet, and weight control.  Please keep your appointments with your specialists as you may have planned  Please go to the LAB in the Basement (turn left off the elevator) for the tests to be done today  You will be contacted by phone if any changes need to be made immediately.  Otherwise, you will receive a letter about your results with an explanation, but please check with MyChart first.  Please remember to sign up for MyChart if you have not done so, as this will be important to you in the future with finding out test results, communicating by private email, and scheduling acute appointments online when needed.  Please return in 6 months, or sooner if needed, with Lab testing done 3-5 days before

## 2016-08-26 NOTE — Assessment & Plan Note (Signed)
stable overall by history and exam, recent data reviewed with pt, and pt to continue medical treatment as before,  to f/u any worsening symptoms or concerns Lab Results  Component Value Date   HGBA1C 6.3 08/23/2016

## 2016-08-26 NOTE — Assessment & Plan Note (Signed)
stable overall by history and exam, recent data reviewed with pt, and pt to continue medical treatment as before,  to f/u any worsening symptoms or concerns BP Readings from Last 3 Encounters:  08/23/16 140/80  02/16/16 110/70  08/17/15 112/70

## 2016-08-26 NOTE — Assessment & Plan Note (Signed)
stable overall by history and exam, recent data reviewed with pt, and pt to continue medical treatment as before,  to f/u any worsening symptoms or concerns Lab Results  Component Value Date   LDLCALC 85 08/23/2016

## 2016-08-26 NOTE — Assessment & Plan Note (Signed)
For f/u PSA, refer back to urology [facility],  to f/u any worsening symptoms or concerns

## 2016-08-29 ENCOUNTER — Other Ambulatory Visit: Payer: Self-pay | Admitting: Internal Medicine

## 2016-09-04 DIAGNOSIS — N138 Other obstructive and reflux uropathy: Secondary | ICD-10-CM | POA: Diagnosis not present

## 2016-09-04 DIAGNOSIS — R972 Elevated prostate specific antigen [PSA]: Secondary | ICD-10-CM | POA: Diagnosis not present

## 2016-09-04 DIAGNOSIS — N401 Enlarged prostate with lower urinary tract symptoms: Secondary | ICD-10-CM | POA: Diagnosis not present

## 2016-09-27 ENCOUNTER — Other Ambulatory Visit: Payer: Self-pay | Admitting: Internal Medicine

## 2016-11-13 ENCOUNTER — Other Ambulatory Visit: Payer: Self-pay | Admitting: Internal Medicine

## 2016-12-10 ENCOUNTER — Other Ambulatory Visit: Payer: Self-pay | Admitting: Internal Medicine

## 2016-12-11 NOTE — Telephone Encounter (Signed)
Done erx 

## 2016-12-12 ENCOUNTER — Other Ambulatory Visit: Payer: Self-pay | Admitting: Internal Medicine

## 2016-12-14 ENCOUNTER — Ambulatory Visit (INDEPENDENT_AMBULATORY_CARE_PROVIDER_SITE_OTHER)
Admission: RE | Admit: 2016-12-14 | Discharge: 2016-12-14 | Disposition: A | Discharge status: Home / Self Care | Payer: Medicare Other | Source: Ambulatory Visit | Attending: Nurse Practitioner | Admitting: Nurse Practitioner

## 2016-12-14 ENCOUNTER — Encounter: Payer: Self-pay | Admitting: Nurse Practitioner

## 2016-12-14 ENCOUNTER — Other Ambulatory Visit: Payer: Self-pay | Admitting: Nurse Practitioner

## 2016-12-14 ENCOUNTER — Ambulatory Visit (INDEPENDENT_AMBULATORY_CARE_PROVIDER_SITE_OTHER): Payer: Medicare Other | Admitting: Nurse Practitioner

## 2016-12-14 VITALS — BP 122/82 | HR 73 | Temp 98.0°F | Ht 69.0 in | Wt 194.0 lb

## 2016-12-14 DIAGNOSIS — J209 Acute bronchitis, unspecified: Secondary | ICD-10-CM | POA: Diagnosis not present

## 2016-12-14 DIAGNOSIS — R059 Cough, unspecified: Secondary | ICD-10-CM

## 2016-12-14 DIAGNOSIS — R05 Cough: Secondary | ICD-10-CM | POA: Diagnosis not present

## 2016-12-14 DIAGNOSIS — R509 Fever, unspecified: Secondary | ICD-10-CM | POA: Diagnosis not present

## 2016-12-14 LAB — POCT URINALYSIS DIPSTICK
Bilirubin, UA: NEGATIVE
Blood, UA: 10
Glucose, UA: NEGATIVE
Ketones, UA: NEGATIVE
LEUKOCYTES UA: NEGATIVE
Nitrite, UA: NEGATIVE
PH UA: 5
Spec Grav, UA: >=1.03
Urobilinogen, UA: 0.2

## 2016-12-14 LAB — POCT INFLUENZA A/B
INFLUENZA B, POC: NEGATIVE
Influenza A, POC: NEGATIVE

## 2016-12-14 MED ORDER — BENZONATATE 100 MG PO CAPS: 100.0000 mg | ORAL_CAPSULE | Freq: Three times a day (TID) | ORAL | 0 refills | Status: DC | PRN

## 2016-12-14 MED ORDER — DM-GUAIFENESIN ER 30-600 MG PO TB12: 1.0000 | ORAL_TABLET | Freq: Two times a day (BID) | ORAL | 0 refills | Status: DC | PRN

## 2016-12-14 MED ORDER — FLUTICASONE PROPIONATE 50 MCG/ACT NA SUSP: 2.0000 | Freq: Every day | NASAL | 0 refills | Status: DC

## 2016-12-14 MED ORDER — CEFDINIR 300 MG PO CAPS
300.0000 mg | ORAL_CAPSULE | Freq: Two times a day (BID) | ORAL | 0 refills | Status: DC
Start: 2016-12-14 — End: 2017-03-20

## 2016-12-14 MED ORDER — ALBUTEROL SULFATE HFA 108 (90 BASE) MCG/ACT IN AERS: 2.0000 | INHALATION_SPRAY | Freq: Four times a day (QID) | RESPIRATORY_TRACT | 0 refills | Status: DC | PRN

## 2016-12-14 NOTE — Progress Notes (Signed)
Reviewed with patient in office. See office note

## 2016-12-14 NOTE — Progress Notes (Signed)
Pre visit review using our clinic review tool, if applicable. No additional management support is needed unless otherwise documented below in the visit note. 

## 2016-12-14 NOTE — Patient Instructions (Addendum)
URI Instructions: Flonase and Afrin use: apply 1spray of afrin in each nare, wait 61mins, then apply 2sprays of flonase in each nare. Use both nasal spray consecutively x 3days, then flonase only for at least 14days.  Encourage adequate oral hydration.  Use over-the-counter  "cold" medicines  such as "Tylenol cold" , "Advil cold",  "Mucinex" or" Mucinex D"  for cough and congestion.  Avoid decongestants if you have high blood pressure. Use" Delsym" or" Robitussin" cough syrup varietis for cough.  You can use plain "Tylenol" or "Advi"l for fever, chills and achyness.   "Common cold" symptoms are usually triggered by a virus.  The antibiotics are usually not necessary. On average, a" viral cold" illness would take 4-7 days to resolve. Please, make an appointment if you are not better or if you're worse.   Go to basement for CXR. You will be called with results.

## 2016-12-14 NOTE — Progress Notes (Signed)
Subjective:  Patient ID: Calvin Wilcox, male    DOB: 1951-03-29  Age: 66 y.o. MRN: [id number]  CC: Cough (coughing 2 days. took terrma flu)   Cough  This is a new problem. The current episode started yesterday. The problem has been gradually worsening. The problem occurs constantly. The cough is non-productive. Associated symptoms include chills, a fever, headaches, nasal congestion, postnasal drip and a sore throat. Pertinent negatives include no chest pain, ear congestion, ear pain, heartburn, hemoptysis, myalgias, shortness of breath or wheezing. Nothing aggravates the symptoms.  reports fever of 102last night, accompanied by brief period of confusion, he was evaluated by EMS personel  Outpatient Medications Prior to Visit  Medication Sig Dispense Refill  . amLODipine (NORVASC) 5 MG tablet TAKE 1 TABLET(5 MG) BY MOUTH DAILY 90 tablet 2  . aspirin 81 MG EC tablet TAKE 1 TABLET BY MOUTH EVERY DAY 90 tablet 3  . atorvastatin (LIPITOR) 40 MG tablet TAKE 1 TABLET BY MOUTH DAILY 90 tablet 0  . cetirizine (ZYRTEC) 10 MG tablet TAKE 1 TABLET(10 MG) BY MOUTH DAILY 30 tablet 0  . HYDROcodone-acetaminophen (NORCO/VICODIN) 5-325 MG per tablet Take 1 tablet by mouth every 6 (six) hours as needed for moderate pain or severe pain. 8 tablet 0  . methocarbamol (ROBAXIN) 500 MG tablet Take 1 tablet (500 mg total) by mouth every 8 (eight) hours as needed for muscle spasms. 20 tablet 0  . omeprazole (PRILOSEC) 20 MG capsule Take 1 capsule (20 mg total) by mouth 2 (two) times daily. 60 capsule 11  . pramipexole (MIRAPEX) 0.25 MG tablet TAKE 1 TABLET(0.25 MG) BY MOUTH AT BEDTIME 90 tablet 2  . tamsulosin (FLOMAX) 0.4 MG CAPS capsule TAKE ONE CAPSULE BY MOUTH DAILY 90 capsule 3   No facility-administered medications prior to visit.     ROS See HPI  Objective:  BP 122/82   Pulse 73   Temp 98 F (36.7 C)   Ht 5\' 9"  (1.753 m)   Wt 194 lb (88 kg)   SpO2 96%   BMI 28.65 kg/m   BP Readings from Last 3  Encounters:  12/14/16 122/82  08/23/16 140/80  02/16/16 110/70    Wt Readings from Last 3 Encounters:  12/14/16 194 lb (88 kg)  08/23/16 193 lb (87.5 kg)  02/16/16 196 lb (88.9 kg)    Physical Exam  Constitutional: He is oriented to person, place, and time. No distress.  HENT:  Right Ear: Tympanic membrane, external ear and ear canal normal.  Left Ear: Tympanic membrane and ear canal normal.  Nose: Mucosal edema and rhinorrhea present. Right sinus exhibits maxillary sinus tenderness and frontal sinus tenderness. Left sinus exhibits maxillary sinus tenderness and frontal sinus tenderness.  Mouth/Throat: Uvula is midline. Posterior oropharyngeal erythema present. No oropharyngeal exudate.  Eyes: No scleral icterus.  Neck: Normal range of motion. Neck supple.  Cardiovascular: Normal rate and regular rhythm.   Pulmonary/Chest: Effort normal and breath sounds normal.  Musculoskeletal: He exhibits no edema.  Lymphadenopathy:    He has no cervical adenopathy.  Neurological: He is alert and oriented to person, place, and time.  Vitals reviewed.   Lab Results  Component Value Date   WBC 7.4 02/16/2016   HGB 15.2 02/16/2016   HCT 45.6 02/16/2016   PLT 340.0 02/16/2016   GLUCOSE 70 08/23/2016   CHOL 148 08/23/2016   TRIG 139.0 08/23/2016   HDL 35.50 (L) 08/23/2016   LDLDIRECT 175.8 09/16/2009   LDLCALC 85 08/23/2016   ALT  20 08/23/2016   AST 22 08/23/2016   NA 139 08/23/2016   K 4.8 08/23/2016   CL 105 08/23/2016   CREATININE 1.42 08/23/2016   BUN 8 08/23/2016   CO2 32 08/23/2016   TSH 1.16 02/16/2016   PSA 5.12 (H) 08/23/2016   HGBA1C 6.3 08/23/2016   MICROALBUR <0.7 02/16/2016    No results found.  Assessment & Plan:   Leman was seen today for cough.  Diagnoses and all orders for this visit:  Acute bronchitis, unspecified organism -     POCT Influenza A/B -     dextromethorphan-guaiFENesin (MUCINEX DM) 30-600 MG 12hr tablet; Take 1 tablet by mouth 2 (two)  times daily as needed for cough. -     fluticasone (FLONASE) 50 MCG/ACT nasal spray; Place 2 sprays into both nostrils daily. -     albuterol (PROVENTIL HFA;VENTOLIN HFA) 108 (90 Base) MCG/ACT inhaler; Inhale 2 puffs into the lungs every 6 (six) hours as needed for wheezing or shortness of breath. -     cefdinir (OMNICEF) 300 MG capsule; Take 1 capsule (300 mg total) by mouth 2 (two) times daily.  Cough -     POCT Influenza A/B -     DG Chest 2 View; Future -     dextromethorphan-guaiFENesin (MUCINEX DM) 30-600 MG 12hr tablet; Take 1 tablet by mouth 2 (two) times daily as needed for cough. -     benzonatate (TESSALON) 100 MG capsule; Take 1 capsule (100 mg total) by mouth 3 (three) times daily as needed for cough. -     albuterol (PROVENTIL HFA;VENTOLIN HFA) 108 (90 Base) MCG/ACT inhaler; Inhale 2 puffs into the lungs every 6 (six) hours as needed for wheezing or shortness of breath. -     cefdinir (OMNICEF) 300 MG capsule; Take 1 capsule (300 mg total) by mouth 2 (two) times daily.  Fever and chills -     POCT Urinalysis Dipstick -     DG Chest 2 View; Future -     albuterol (PROVENTIL HFA;VENTOLIN HFA) 108 (90 Base) MCG/ACT inhaler; Inhale 2 puffs into the lungs every 6 (six) hours as needed for wheezing or shortness of breath. -     cefdinir (OMNICEF) 300 MG capsule; Take 1 capsule (300 mg total) by mouth 2 (two) times daily.   I am having Mr. Harmsen start on dextromethorphan-guaiFENesin, fluticasone, benzonatate, albuterol, and cefdinir. I am also having him maintain his omeprazole, HYDROcodone-acetaminophen, methocarbamol, aspirin, tamsulosin, amLODipine, atorvastatin, cetirizine, and pramipexole.  Meds ordered this encounter  Medications  . DISCONTD: atorvastatin (LIPITOR) 40 MG tablet    Sig: Take 40 mg by mouth 1 day or 1 dose.  [name] dextromethorphan-guaiFENesin (MUCINEX DM) 30-600 MG 12hr tablet    Sig: Take 1 tablet by mouth 2 (two) times daily as needed for cough.    Dispense:  14  tablet    Refill:  0    Order Specific Question:   Supervising Provider    Answer:   Cassandria Anger [[id number]]  . fluticasone (FLONASE) 50 MCG/ACT nasal spray    Sig: Place 2 sprays into both nostrils daily.    Dispense:  16 g    Refill:  0    Order Specific Question:   Supervising Provider    Answer:   Cassandria Anger [[id number]]  . benzonatate (TESSALON) 100 MG capsule    Sig: Take 1 capsule (100 mg total) by mouth 3 (three) times daily as needed for cough.    Dispense:  20 capsule    Refill:  0    Order Specific Question:   Supervising Provider    Answer:   Cassandria Anger [[id number]]  . albuterol (PROVENTIL HFA;VENTOLIN HFA) 108 (90 Base) MCG/ACT inhaler    Sig: Inhale 2 puffs into the lungs every 6 (six) hours as needed for wheezing or shortness of breath.    Dispense:  1 Inhaler    Refill:  0    Order Specific Question:   Supervising Provider    Answer:   Cassandria Anger [[id number]]  . cefdinir (OMNICEF) 300 MG capsule    Sig: Take 1 capsule (300 mg total) by mouth 2 (two) times daily.    Dispense:  14 capsule    Refill:  0    Order Specific Question:   Supervising Provider    Answer:   Cassandria Anger [[id number]]    Follow-up: Return if symptoms worsen or fail to improve.  Wilfred Lacy, NP

## 2016-12-17 NOTE — Telephone Encounter (Signed)
Routing to dr john, please advise, thanks 

## 2017-01-05 ENCOUNTER — Other Ambulatory Visit: Payer: Self-pay | Admitting: Nurse Practitioner

## 2017-01-05 DIAGNOSIS — R059 Cough, unspecified: Secondary | ICD-10-CM

## 2017-01-05 DIAGNOSIS — R05 Cough: Secondary | ICD-10-CM

## 2017-01-05 DIAGNOSIS — J209 Acute bronchitis, unspecified: Secondary | ICD-10-CM

## 2017-01-05 DIAGNOSIS — R509 Fever, unspecified: Secondary | ICD-10-CM

## 2017-01-07 NOTE — Telephone Encounter (Signed)
Routing to [place], are you ok with additonal refill, or do you need to see patient again----please advise, thanks

## 2017-02-20 ENCOUNTER — Ambulatory Visit: Payer: Medicare Other | Admitting: Internal Medicine

## 2017-03-05 DIAGNOSIS — N138 Other obstructive and reflux uropathy: Secondary | ICD-10-CM | POA: Diagnosis not present

## 2017-03-05 DIAGNOSIS — R972 Elevated prostate specific antigen [PSA]: Secondary | ICD-10-CM | POA: Diagnosis not present

## 2017-03-05 DIAGNOSIS — N401 Enlarged prostate with lower urinary tract symptoms: Secondary | ICD-10-CM | POA: Diagnosis not present

## 2017-03-06 ENCOUNTER — Other Ambulatory Visit: Payer: Self-pay | Admitting: Internal Medicine

## 2017-03-20 ENCOUNTER — Other Ambulatory Visit (INDEPENDENT_AMBULATORY_CARE_PROVIDER_SITE_OTHER): Payer: Medicare Other

## 2017-03-20 ENCOUNTER — Encounter: Payer: Self-pay | Admitting: Internal Medicine

## 2017-03-20 ENCOUNTER — Ambulatory Visit (INDEPENDENT_AMBULATORY_CARE_PROVIDER_SITE_OTHER): Payer: Medicare Other | Admitting: Internal Medicine

## 2017-03-20 VITALS — BP 118/68 | HR 68 | Temp 98.5°F | Ht 69.0 in | Wt 191.0 lb

## 2017-03-20 DIAGNOSIS — Z0000 Encounter for general adult medical examination without abnormal findings: Secondary | ICD-10-CM | POA: Diagnosis not present

## 2017-03-20 DIAGNOSIS — E119 Type 2 diabetes mellitus without complications: Secondary | ICD-10-CM

## 2017-03-20 DIAGNOSIS — J209 Acute bronchitis, unspecified: Secondary | ICD-10-CM

## 2017-03-20 DIAGNOSIS — R05 Cough: Secondary | ICD-10-CM

## 2017-03-20 DIAGNOSIS — Z23 Encounter for immunization: Secondary | ICD-10-CM | POA: Diagnosis not present

## 2017-03-20 DIAGNOSIS — R059 Cough, unspecified: Secondary | ICD-10-CM

## 2017-03-20 DIAGNOSIS — R509 Fever, unspecified: Secondary | ICD-10-CM

## 2017-03-20 LAB — URINALYSIS, ROUTINE W REFLEX MICROSCOPIC
Bilirubin Urine: NEGATIVE
HGB URINE DIPSTICK: NEGATIVE
Ketones, ur: NEGATIVE
LEUKOCYTES UA: NEGATIVE
Nitrite: NEGATIVE
RBC / HPF: NONE SEEN (ref 0–?)
SPECIFIC GRAVITY, URINE: 1.025 (ref 1.000–1.030)
TOTAL PROTEIN, URINE-UPE24: NEGATIVE
URINE GLUCOSE: NEGATIVE
Urobilinogen, UA: 0.2 (ref 0.0–1.0)
pH: 5.5 (ref 5.0–8.0)

## 2017-03-20 LAB — CBC WITH DIFFERENTIAL/PLATELET
Basophils Absolute: 0.1 10*3/uL (ref 0.0–0.1)
Basophils Relative: 1 % (ref 0.0–3.0)
EOS ABS: 0.1 10*3/uL (ref 0.0–0.7)
EOS PCT: 1.3 % (ref 0.0–5.0)
HCT: 41.7 % (ref 39.0–52.0)
Hemoglobin: 14.1 g/dL (ref 13.0–17.0)
LYMPHS ABS: 2 10*3/uL (ref 0.7–4.0)
Lymphocytes Relative: 33.4 % (ref 12.0–46.0)
MCHC: 33.8 g/dL (ref 30.0–36.0)
MCV: 87.8 fl (ref 78.0–100.0)
MONO ABS: 0.6 10*3/uL (ref 0.1–1.0)
Monocytes Relative: 10.7 % (ref 3.0–12.0)
NEUTROS PCT: 53.6 % (ref 43.0–77.0)
Neutro Abs: 3.2 10*3/uL (ref 1.4–7.7)
Platelets: 314 10*3/uL (ref 150.0–400.0)
RBC: 4.75 Mil/uL (ref 4.22–5.81)
RDW: 18 % — ABNORMAL HIGH (ref 11.5–15.5)
WBC: 6.1 10*3/uL (ref 4.0–10.5)

## 2017-03-20 LAB — LIPID PANEL
CHOLESTEROL: 155 mg/dL (ref 0–200)
HDL: 36.7 mg/dL — ABNORMAL LOW (ref >39.00)
LDL Cholesterol: 103 mg/dL — ABNORMAL HIGH (ref 0–99)
NonHDL: 118.35
TRIGLYCERIDES: 77 mg/dL (ref 0.0–149.0)
Total CHOL/HDL Ratio: 4
VLDL: 15.4 mg/dL (ref 0.0–40.0)

## 2017-03-20 LAB — BASIC METABOLIC PANEL
BUN: 13 mg/dL (ref 6–23)
CHLORIDE: 104 meq/L (ref 96–112)
CO2: 28 meq/L (ref 19–32)
Calcium: 9.4 mg/dL (ref 8.4–10.5)
Creatinine, Ser: 1.4 mg/dL (ref 0.40–1.50)
GFR: 64.92 mL/min (ref >60.00)
GLUCOSE: 80 mg/dL (ref 70–99)
POTASSIUM: 5 meq/L (ref 3.5–5.1)
SODIUM: 138 meq/L (ref 135–145)

## 2017-03-20 LAB — HEPATIC FUNCTION PANEL
ALK PHOS: 83 U/L (ref 39–117)
ALT: 21 U/L (ref 0–53)
AST: 30 U/L (ref 0–37)
Albumin: 4.3 g/dL (ref 3.5–5.2)
Bilirubin, Direct: 0.1 mg/dL (ref 0.0–0.3)
TOTAL PROTEIN: 7.6 g/dL (ref 6.0–8.3)
Total Bilirubin: 0.6 mg/dL (ref 0.2–1.2)

## 2017-03-20 LAB — HEMOGLOBIN A1C: HEMOGLOBIN A1C: 6.3 % (ref 4.6–6.5)

## 2017-03-20 LAB — MICROALBUMIN / CREATININE URINE RATIO
CREATININE, U: 151.4 mg/dL
MICROALB UR: 0.8 mg/dL (ref 0.0–1.9)
Microalb Creat Ratio: 0.5 mg/g (ref 0.0–30.0)

## 2017-03-20 LAB — PSA: PSA: 5.97 ng/mL — ABNORMAL HIGH (ref 0.10–4.00)

## 2017-03-20 LAB — TSH: TSH: 1.24 u[IU]/mL (ref 0.35–4.50)

## 2017-03-20 MED ORDER — ATORVASTATIN CALCIUM 40 MG PO TABS: 40.0000 mg | ORAL_TABLET | Freq: Every day | ORAL | 3 refills | Status: DC

## 2017-03-20 MED ORDER — PRAMIPEXOLE DIHYDROCHLORIDE 0.25 MG PO TABS: ORAL_TABLET | ORAL | 3 refills | Status: DC

## 2017-03-20 MED ORDER — OMEPRAZOLE 20 MG PO CPDR: 20.0000 mg | DELAYED_RELEASE_CAPSULE | Freq: Two times a day (BID) | ORAL | 3 refills | Status: DC

## 2017-03-20 MED ORDER — TAMSULOSIN HCL 0.4 MG PO CAPS: 0.4000 mg | ORAL_CAPSULE | Freq: Every day | ORAL | 3 refills | Status: DC

## 2017-03-20 MED ORDER — AMLODIPINE BESYLATE 5 MG PO TABS: ORAL_TABLET | ORAL | 3 refills | Status: DC

## 2017-03-20 NOTE — Assessment & Plan Note (Addendum)
Diet controlled, o/w stable overall by history and exam, recent data reviewed with pt, and pt to continue medical treatment as before,  to f/u any worsening symptoms or concerns Lab Results  Component Value Date   HGBA1C 6.3 08/23/2016

## 2017-03-20 NOTE — Assessment & Plan Note (Signed)

## 2017-03-20 NOTE — Patient Instructions (Addendum)
You had the Prevnar pneumonia shot today  Please remember to call for your yearly eye doctor appt  Please continue all other medications as before, and refills have been done if requested.  Please have the pharmacy call with any other refills you may need.  Please continue your efforts at being more active, low cholesterol diet, and weight control.  You are otherwise up to date with prevention measures today.  Please keep your appointments with your specialists as you may have planned  Please go to the LAB in the Basement (turn left off the elevator) for the tests to be done today  You will be contacted by phone if any changes need to be made immediately.  Otherwise, you will receive a letter about your results with an explanation, but please check with MyChart first.  Please remember to sign up for MyChart if you have not done so, as this will be important to you in the future with finding out test results, communicating by private email, and scheduling acute appointments online when needed.  If you have Medicare related insurance (such as traditional Medicare, [employer] or [employer], or similar), Please make an appointment at the [facility] with Sharee Pimple, the [employer], for your Wellness Visit in this office, which is a benefit with your insurance.  Please return in 6 months, or sooner if needed, with Lab testing done 3-5 days before
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
## 2017-03-20 NOTE — Progress Notes (Signed)
Pre visit review using our clinic review tool, if applicable. No additional management support is needed unless otherwise documented below in the visit note. 

## 2017-03-21 LAB — HIV ANTIBODY (ROUTINE TESTING W REFLEX): HIV 1&2 Ab, 4th Generation: NONREACTIVE

## 2017-03-28 ENCOUNTER — Other Ambulatory Visit: Payer: Self-pay | Admitting: Internal Medicine

## 2017-04-08 ENCOUNTER — Other Ambulatory Visit: Payer: Self-pay | Admitting: Internal Medicine

## 2017-06-17 ENCOUNTER — Other Ambulatory Visit: Payer: Self-pay | Admitting: Internal Medicine

## 2017-07-17 ENCOUNTER — Other Ambulatory Visit (INDEPENDENT_AMBULATORY_CARE_PROVIDER_SITE_OTHER): Payer: Medicare Other

## 2017-07-17 ENCOUNTER — Encounter: Payer: Self-pay | Admitting: Internal Medicine

## 2017-07-17 ENCOUNTER — Ambulatory Visit (INDEPENDENT_AMBULATORY_CARE_PROVIDER_SITE_OTHER): Payer: Medicare Other | Admitting: Internal Medicine

## 2017-07-17 VITALS — BP 130/86 | HR 60 | Ht 69.0 in | Wt 190.0 lb

## 2017-07-17 DIAGNOSIS — R310 Gross hematuria: Secondary | ICD-10-CM | POA: Diagnosis not present

## 2017-07-17 DIAGNOSIS — R972 Elevated prostate specific antigen [PSA]: Secondary | ICD-10-CM

## 2017-07-17 DIAGNOSIS — I10 Essential (primary) hypertension: Secondary | ICD-10-CM

## 2017-07-17 LAB — URINALYSIS, ROUTINE W REFLEX MICROSCOPIC
BILIRUBIN URINE: NEGATIVE
HGB URINE DIPSTICK: NEGATIVE
KETONES UR: NEGATIVE
LEUKOCYTES UA: NEGATIVE
NITRITE: NEGATIVE
PH: 6 (ref 5.0–8.0)
RBC / HPF: NONE SEEN (ref 0–?)
Specific Gravity, Urine: 1.015 (ref 1.000–1.030)
TOTAL PROTEIN, URINE-UPE24: NEGATIVE
URINE GLUCOSE: NEGATIVE
UROBILINOGEN UA: 0.2 (ref 0.0–1.0)
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
## 2017-07-17 NOTE — Patient Instructions (Signed)
Please continue all other medications as before, and refills have been done if requested.  Please have the pharmacy call with any other refills you may need.  Please keep your appointments with your specialists as you may have planned  You will be contacted regarding the referral for: Urology  Please go to the LAB in the Basement (turn left off the elevator) for the tests to be done today - just the urine testing today  You will be contacted by phone if any changes need to be made immediately.  Otherwise, you will receive a letter about your results with an explanation, but please check with MyChart first.  Please remember to sign up for MyChart if you have not done so, as this will be important to you in the future with finding out test results, communicating by private email, and scheduling acute appointments online when needed.     

## 2017-07-17 NOTE — Assessment & Plan Note (Signed)
acute onset x 1 episode relatively small volume yesterday, with some difficutly passing urine, but no pain, fever, increased back or flank pain, or f/c;  Will check urine studies, hold antibx for now, and given his age will need urology evaluation as malignancy cannot be ruled out

## 2017-07-18 LAB — URINE CULTURE: Organism ID, Bacteria: NO GROWTH

## 2017-07-20 NOTE — Assessment & Plan Note (Signed)
Mild, also for urology f/u,  to f/u any worsening symptoms or concerns

## 2017-07-20 NOTE — Assessment & Plan Note (Signed)
stable overall by history and exam, recent data reviewed with pt, and pt to continue medical treatment as before,  to f/u any worsening symptoms or concerns BP Readings from Last 3 Encounters:  07/17/17 130/86  03/20/17 118/68  12/14/16 122/82

## 2017-07-29 DIAGNOSIS — R319 Hematuria, unspecified: Secondary | ICD-10-CM | POA: Diagnosis not present

## 2017-07-29 DIAGNOSIS — R972 Elevated prostate specific antigen [PSA]: Secondary | ICD-10-CM | POA: Diagnosis not present

## 2017-07-29 DIAGNOSIS — N138 Other obstructive and reflux uropathy: Secondary | ICD-10-CM | POA: Diagnosis not present

## 2017-07-29 DIAGNOSIS — N401 Enlarged prostate with lower urinary tract symptoms: Secondary | ICD-10-CM | POA: Diagnosis not present

## 2017-08-13 DIAGNOSIS — H40033 Anatomical narrow angle, bilateral: Secondary | ICD-10-CM | POA: Diagnosis not present

## 2017-08-13 DIAGNOSIS — H2513 Age-related nuclear cataract, bilateral: Secondary | ICD-10-CM | POA: Diagnosis not present

## 2017-08-28 ENCOUNTER — Other Ambulatory Visit: Payer: Self-pay | Admitting: Internal Medicine

## 2017-09-19 ENCOUNTER — Encounter: Payer: Self-pay | Admitting: Internal Medicine

## 2017-09-19 ENCOUNTER — Other Ambulatory Visit: Payer: Self-pay | Admitting: Internal Medicine

## 2017-09-19 ENCOUNTER — Other Ambulatory Visit (INDEPENDENT_AMBULATORY_CARE_PROVIDER_SITE_OTHER): Payer: Medicare Other

## 2017-09-19 ENCOUNTER — Telehealth: Payer: Self-pay

## 2017-09-19 ENCOUNTER — Ambulatory Visit (INDEPENDENT_AMBULATORY_CARE_PROVIDER_SITE_OTHER): Payer: Medicare Other | Admitting: Internal Medicine

## 2017-09-19 VITALS — BP 132/86 | HR 68 | Temp 97.7°F | Ht 69.0 in | Wt 202.0 lb

## 2017-09-19 DIAGNOSIS — Z0000 Encounter for general adult medical examination without abnormal findings: Secondary | ICD-10-CM | POA: Diagnosis not present

## 2017-09-19 DIAGNOSIS — E785 Hyperlipidemia, unspecified: Secondary | ICD-10-CM | POA: Diagnosis not present

## 2017-09-19 DIAGNOSIS — E119 Type 2 diabetes mellitus without complications: Secondary | ICD-10-CM

## 2017-09-19 DIAGNOSIS — R972 Elevated prostate specific antigen [PSA]: Secondary | ICD-10-CM

## 2017-09-19 DIAGNOSIS — I10 Essential (primary) hypertension: Secondary | ICD-10-CM

## 2017-09-19 DIAGNOSIS — R319 Hematuria, unspecified: Secondary | ICD-10-CM

## 2017-09-19 LAB — BASIC METABOLIC PANEL
BUN: 15 mg/dL (ref 6–23)
CALCIUM: 8.8 mg/dL (ref 8.4–10.5)
CO2: 29 mEq/L (ref 19–32)
CREATININE: 1.53 mg/dL — AB (ref 0.40–1.50)
Chloride: 102 mEq/L (ref 96–112)
GFR: 58.51 mL/min — AB (ref >60.00)
Glucose, Bld: 92 mg/dL (ref 70–99)
POTASSIUM: 4.3 meq/L (ref 3.5–5.1)
Sodium: 136 mEq/L (ref 135–145)

## 2017-09-19 LAB — HEPATIC FUNCTION PANEL
ALT: 17 U/L (ref 0–53)
AST: 25 U/L (ref 0–37)
Albumin: 4.4 g/dL (ref 3.5–5.2)
Alkaline Phosphatase: 99 U/L (ref 39–117)
BILIRUBIN TOTAL: 0.6 mg/dL (ref 0.2–1.2)
Bilirubin, Direct: 0.1 mg/dL (ref 0.0–0.3)
Total Protein: 7.6 g/dL (ref 6.0–8.3)

## 2017-09-19 LAB — LIPID PANEL
CHOL/HDL RATIO: 5
Cholesterol: 172 mg/dL (ref 0–200)
HDL: 36.2 mg/dL — AB (ref >39.00)
LDL CALC: 108 mg/dL — AB (ref 0–99)
NonHDL: 135.61
TRIGLYCERIDES: 136 mg/dL (ref 0.0–149.0)
VLDL: 27.2 mg/dL (ref 0.0–40.0)

## 2017-09-19 LAB — HEMOGLOBIN A1C: Hgb A1c MFr Bld: 6.6 % — ABNORMAL HIGH (ref 4.6–6.5)

## 2017-09-19 LAB — PSA: PSA: 10.36 ng/mL — AB (ref 0.10–4.00)

## 2017-09-19 NOTE — Assessment & Plan Note (Signed)
stable overall by history and exam, recent data reviewed with pt, and pt to continue medical treatment as before,  to f/u any worsening symptoms or concerns BP Readings from Last 3 Encounters:  09/19/17 132/86  07/17/17 130/86  03/20/17 118/68

## 2017-09-19 NOTE — Telephone Encounter (Signed)
Pt wife called and I gave her the results. She states understanding and they did go to the urologists in august and saw Dr. Lupita Leash, they would like to go back to him. They will try to go ahead and call and make an appointment and if they cant they would like their referral sent there.  Please advise

## 2017-09-19 NOTE — Telephone Encounter (Signed)
-----   Message from Biagio Borg, MD sent at 09/19/2017 12:07 PM EDT ----- Left message on MyChart, pt to cont same tx except  The test results show that your current treatment is OK, except the PSA has gone up quite a bit  I recall we had tried to refer you to Urology in August 2018 for blood in the urine, but I am not sure you have been to that appointment.  We should also refer you to Urology now for elevated PSA level as well.  You should hear from the office    Zuleika Gallus to please inform pt, I will do referral

## 2017-09-19 NOTE — Assessment & Plan Note (Signed)
Asympt, also for psa recheck to confirm stability
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
## 2017-09-19 NOTE — Telephone Encounter (Signed)
Called pt, LVM for wife to call back to discuss pt's results.

## 2017-09-19 NOTE — Patient Instructions (Signed)
Please continue all other medications as before, and refills have been done if requested.  Please have the pharmacy call with any other refills you may need.  Please continue your efforts at being more active, low cholesterol diabetic diet, and weight control.  Please keep your appointments with your specialists as you may have planned  Please go to the LAB in the Basement (turn left off the elevator) for the tests to be done today  You will be contacted by phone if any changes need to be made immediately.  Otherwise, you will receive a letter about your results with an explanation, but please check with MyChart first.  Please remember to sign up for MyChart if you have not done so, as this will be important to you in the future with finding out test results, communicating by private email, and scheduling acute appointments online when needed.  Please return in 6 months, or sooner if needed, with Lab testing done 3-5 days before

## 2017-09-19 NOTE — Assessment & Plan Note (Signed)
stable overall by history and exam, recent data reviewed with pt, and pt to continue medical treatment as before,  to f/u any worsening symptoms or concerns Lab Results  Component Value Date   LDLCALC 108 (H) 09/19/2017

## 2017-09-19 NOTE — Assessment & Plan Note (Signed)
stable overall by history and exam, recent data reviewed with pt, and pt to continue medical treatment as before,  to f/u any worsening symptoms or concerns, for f/u lab today 

## 2017-09-19 NOTE — Telephone Encounter (Signed)
Ok, I will forward this to Felicity to note pt wife request

## 2018-03-20 ENCOUNTER — Ambulatory Visit (INDEPENDENT_AMBULATORY_CARE_PROVIDER_SITE_OTHER): Payer: Medicare Other | Admitting: Internal Medicine

## 2018-03-20 ENCOUNTER — Encounter: Payer: Self-pay | Admitting: Internal Medicine

## 2018-03-20 ENCOUNTER — Other Ambulatory Visit (INDEPENDENT_AMBULATORY_CARE_PROVIDER_SITE_OTHER): Payer: Medicare Other

## 2018-03-20 VITALS — BP 116/82 | HR 63 | Temp 98.4°F | Ht 69.0 in | Wt 206.0 lb

## 2018-03-20 DIAGNOSIS — R972 Elevated prostate specific antigen [PSA]: Secondary | ICD-10-CM

## 2018-03-20 DIAGNOSIS — Z1159 Encounter for screening for other viral diseases: Secondary | ICD-10-CM | POA: Diagnosis not present

## 2018-03-20 DIAGNOSIS — E119 Type 2 diabetes mellitus without complications: Secondary | ICD-10-CM | POA: Diagnosis not present

## 2018-03-20 DIAGNOSIS — Z23 Encounter for immunization: Secondary | ICD-10-CM | POA: Diagnosis not present

## 2018-03-20 DIAGNOSIS — Z0000 Encounter for general adult medical examination without abnormal findings: Secondary | ICD-10-CM

## 2018-03-20 LAB — URINALYSIS, ROUTINE W REFLEX MICROSCOPIC
Bilirubin Urine: NEGATIVE
Hgb urine dipstick: NEGATIVE
Ketones, ur: NEGATIVE
Leukocytes, UA: NEGATIVE
Nitrite: NEGATIVE
SPECIFIC GRAVITY, URINE: 1.02 (ref 1.000–1.030)
Total Protein, Urine: NEGATIVE
Urine Glucose: NEGATIVE
Urobilinogen, UA: 0.2 (ref 0.0–1.0)
pH: 6 (ref 5.0–8.0)

## 2018-03-20 LAB — LIPID PANEL
Cholesterol: 156 mg/dL (ref 0–200)
HDL: 37.2 mg/dL — AB (ref >39.00)
LDL Cholesterol: 97 mg/dL (ref 0–99)
NONHDL: 119.07
TRIGLYCERIDES: 109 mg/dL (ref 0.0–149.0)
Total CHOL/HDL Ratio: 4
VLDL: 21.8 mg/dL (ref 0.0–40.0)

## 2018-03-20 LAB — BASIC METABOLIC PANEL
BUN: 13 mg/dL (ref 6–23)
CO2: 30 mEq/L (ref 19–32)
Calcium: 9.1 mg/dL (ref 8.4–10.5)
Chloride: 103 mEq/L (ref 96–112)
Creatinine, Ser: 1.59 mg/dL — ABNORMAL HIGH (ref 0.40–1.50)
GFR: 55.89 mL/min — AB (ref >60.00)
Glucose, Bld: 93 mg/dL (ref 70–99)
Potassium: 4.5 mEq/L (ref 3.5–5.1)
SODIUM: 138 meq/L (ref 135–145)

## 2018-03-20 LAB — PSA: PSA: 6.2 ng/mL — ABNORMAL HIGH (ref 0.10–4.00)

## 2018-03-20 LAB — HEPATIC FUNCTION PANEL
ALBUMIN: 4.2 g/dL (ref 3.5–5.2)
ALT: 21 U/L (ref 0–53)
AST: 25 U/L (ref 0–37)
Alkaline Phosphatase: 98 U/L (ref 39–117)
Bilirubin, Direct: 0.1 mg/dL (ref 0.0–0.3)
Total Bilirubin: 0.7 mg/dL (ref 0.2–1.2)
Total Protein: 7.5 g/dL (ref 6.0–8.3)

## 2018-03-20 LAB — MICROALBUMIN / CREATININE URINE RATIO
CREATININE, U: 133.3 mg/dL
MICROALB/CREAT RATIO: 0.6 mg/g (ref 0.0–30.0)
Microalb, Ur: 0.9 mg/dL (ref 0.0–1.9)

## 2018-03-20 LAB — CBC WITH DIFFERENTIAL/PLATELET
Basophils Absolute: 0 10*3/uL (ref 0.0–0.1)
Basophils Relative: 0.3 % (ref 0.0–3.0)
EOS PCT: 9.9 % — AB (ref 0.0–5.0)
Eosinophils Absolute: 0.5 10*3/uL (ref 0.0–0.7)
HEMATOCRIT: 40 % (ref 39.0–52.0)
HEMOGLOBIN: 13.6 g/dL (ref 13.0–17.0)
Lymphocytes Relative: 34.8 % (ref 12.0–46.0)
Lymphs Abs: 1.6 10*3/uL (ref 0.7–4.0)
MCHC: 33.9 g/dL (ref 30.0–36.0)
MCV: 84.6 fl (ref 78.0–100.0)
MONOS PCT: 13.2 % — AB (ref 3.0–12.0)
Monocytes Absolute: 0.6 10*3/uL (ref 0.1–1.0)
Neutro Abs: 1.9 10*3/uL (ref 1.4–7.7)
Neutrophils Relative %: 41.8 % — ABNORMAL LOW (ref 43.0–77.0)
Platelets: 333 10*3/uL (ref 150.0–400.0)
RBC: 4.73 Mil/uL (ref 4.22–5.81)
RDW: 16.1 % — ABNORMAL HIGH (ref 11.5–15.5)
WBC: 4.7 10*3/uL (ref 4.0–10.5)

## 2018-03-20 LAB — HEMOGLOBIN A1C: Hgb A1c MFr Bld: 7.1 % — ABNORMAL HIGH (ref 4.6–6.5)

## 2018-03-20 LAB — TSH: TSH: 2.35 u[IU]/mL (ref 0.35–4.50)

## 2018-03-20 NOTE — Assessment & Plan Note (Signed)

## 2018-03-20 NOTE — Assessment & Plan Note (Signed)
Due to massive BPH per urology aug 2018, cont to follow

## 2018-03-20 NOTE — Assessment & Plan Note (Signed)
stable overall by history and exam, recent data reviewed with pt, and pt to continue medical treatment as before,  to f/u any worsening symptoms or concerns Lab Results  Component Value Date   HGBA1C 6.6 (H) 09/19/2017

## 2018-03-20 NOTE — Patient Instructions (Addendum)
You had the Pneumovax pneumonia shot today  Please continue all other medications as before, and refills have been done if requested.  Please have the pharmacy call with any other refills you may need.  Please continue your efforts at being more active, low cholesterol diet, and weight control.  You are otherwise up to date with prevention measures today.  Please keep your appointments with your specialists as you may have planned  Please go to the LAB in the Basement (turn left off the elevator) for the tests to be done today  You will be contacted by phone if any changes need to be made immediately.  Otherwise, you will receive a letter about your results with an explanation, but please check with MyChart first.  Please remember to sign up for MyChart if you have not done so, as this will be important to you in the future with finding out test results, communicating by private email, and scheduling acute appointments online when needed.  Please return in 6 months, or sooner if needed, with Lab testing done 3-5 days before    
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
## 2018-03-21 LAB — HEPATITIS C ANTIBODY
HEP C AB: NONREACTIVE
SIGNAL TO CUT-OFF: 0.06 (ref <1.00)

## 2018-03-26 DIAGNOSIS — Z87438 Personal history of other diseases of male genital organs: Secondary | ICD-10-CM | POA: Insufficient documentation

## 2018-03-27 DIAGNOSIS — N400 Enlarged prostate without lower urinary tract symptoms: Secondary | ICD-10-CM | POA: Diagnosis not present

## 2018-03-27 DIAGNOSIS — R972 Elevated prostate specific antigen [PSA]: Secondary | ICD-10-CM | POA: Diagnosis not present

## 2018-04-09 ENCOUNTER — Other Ambulatory Visit: Payer: Self-pay | Admitting: Internal Medicine

## 2018-05-09 ENCOUNTER — Other Ambulatory Visit: Payer: Self-pay | Admitting: Internal Medicine

## 2018-05-12 ENCOUNTER — Encounter: Payer: Self-pay | Admitting: Internal Medicine

## 2018-05-12 ENCOUNTER — Ambulatory Visit (INDEPENDENT_AMBULATORY_CARE_PROVIDER_SITE_OTHER): Payer: Medicare Other | Admitting: Internal Medicine

## 2018-05-12 ENCOUNTER — Other Ambulatory Visit (INDEPENDENT_AMBULATORY_CARE_PROVIDER_SITE_OTHER): Payer: Medicare Other

## 2018-05-12 VITALS — BP 144/90 | HR 63 | Temp 97.9°F | Ht 69.0 in | Wt 209.0 lb

## 2018-05-12 DIAGNOSIS — E119 Type 2 diabetes mellitus without complications: Secondary | ICD-10-CM | POA: Diagnosis not present

## 2018-05-12 DIAGNOSIS — R600 Localized edema: Secondary | ICD-10-CM

## 2018-05-12 DIAGNOSIS — I10 Essential (primary) hypertension: Secondary | ICD-10-CM

## 2018-05-12 LAB — CBC WITH DIFFERENTIAL/PLATELET
BASOS ABS: 0.1 10*3/uL (ref 0.0–0.1)
Basophils Relative: 1.7 % (ref 0.0–3.0)
Eosinophils Absolute: 0.3 10*3/uL (ref 0.0–0.7)
Eosinophils Relative: 4.8 % (ref 0.0–5.0)
HCT: 40.1 % (ref 39.0–52.0)
Hemoglobin: 13.2 g/dL (ref 13.0–17.0)
LYMPHS ABS: 2.4 10*3/uL (ref 0.7–4.0)
Lymphocytes Relative: 38.4 % (ref 12.0–46.0)
MCHC: 32.9 g/dL (ref 30.0–36.0)
MCV: 85.4 fl (ref 78.0–100.0)
MONO ABS: 0.8 10*3/uL (ref 0.1–1.0)
Monocytes Relative: 12.6 % — ABNORMAL HIGH (ref 3.0–12.0)
NEUTROS PCT: 42.5 % — AB (ref 43.0–77.0)
Neutro Abs: 2.7 10*3/uL (ref 1.4–7.7)
Platelets: 345 10*3/uL (ref 150.0–400.0)
RBC: 4.7 Mil/uL (ref 4.22–5.81)
RDW: 16.1 % — ABNORMAL HIGH (ref 11.5–15.5)
WBC: 6.3 10*3/uL (ref 4.0–10.5)

## 2018-05-12 LAB — HEPATIC FUNCTION PANEL
ALT: 20 U/L (ref 0–53)
AST: 23 U/L (ref 0–37)
Albumin: 4 g/dL (ref 3.5–5.2)
Alkaline Phosphatase: 113 U/L (ref 39–117)
BILIRUBIN DIRECT: 0.1 mg/dL (ref 0.0–0.3)
BILIRUBIN TOTAL: 0.5 mg/dL (ref 0.2–1.2)
Total Protein: 7.3 g/dL (ref 6.0–8.3)

## 2018-05-12 LAB — BASIC METABOLIC PANEL
BUN: 11 mg/dL (ref 6–23)
CHLORIDE: 103 meq/L (ref 96–112)
CO2: 31 meq/L (ref 19–32)
Calcium: 8.9 mg/dL (ref 8.4–10.5)
Creatinine, Ser: 1.39 mg/dL (ref 0.40–1.50)
GFR: 65.24 mL/min (ref >60.00)
Glucose, Bld: 145 mg/dL — ABNORMAL HIGH (ref 70–99)
Potassium: 3.9 mEq/L (ref 3.5–5.1)
SODIUM: 138 meq/L (ref 135–145)

## 2018-05-12 LAB — BRAIN NATRIURETIC PEPTIDE: PRO B NATRI PEPTIDE: 23 pg/mL (ref 0.0–100.0)

## 2018-05-12 MED ORDER — TRIAMTERENE-HCTZ 37.5-25 MG PO TABS: 1.0000 | ORAL_TABLET | Freq: Every day | ORAL | 3 refills | Status: DC
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
## 2018-05-12 NOTE — Assessment & Plan Note (Signed)
With mild increase, for triam/hct asd , cont other tx,  to f/u any worsening symptoms or concerns

## 2018-05-12 NOTE — Assessment & Plan Note (Signed)
stable overall by history and exam, recent data reviewed with pt, and pt to continue medical treatment as before,  to f/u any worsening symptoms or concerns Lab Results  Component Value Date   HGBA1C 7.1 (H) 03/20/2018

## 2018-05-12 NOTE — Assessment & Plan Note (Signed)
Etiology unclear, with elevated Bp will start triam/hct, check echo r/o chf, check BNP and labs as ordered, f/u 1 mo

## 2018-05-12 NOTE — Patient Instructions (Addendum)
Your EKG was OK today  You will be contacted regarding the referral for: echocardiogram  Please take all new medication as prescribed - the fluid pill for fluid and blood pressure  Please continue all other medications as before, and refills have been done if requested.  Please have the pharmacy call with any other refills you may need.  Please keep your appointments with your specialists as you may have planned  Please go to the LAB in the Basement (turn left off the elevator) for the tests to be done today  You will be contacted by phone if any changes need to be made immediately.  Otherwise, you will receive a letter about your results with an explanation, but please check with MyChart first.  Please remember to sign up for MyChart if you have not done so, as this will be important to you in the future with finding out test results, communicating by private email, and scheduling acute appointments online when needed.  Please return in 1 month, or sooner if needed

## 2018-05-16 ENCOUNTER — Ambulatory Visit (HOSPITAL_COMMUNITY): Payer: Medicare Other | Attending: Internal Medicine

## 2018-05-16 ENCOUNTER — Other Ambulatory Visit: Payer: Self-pay

## 2018-05-16 DIAGNOSIS — I5031 Acute diastolic (congestive) heart failure: Secondary | ICD-10-CM | POA: Diagnosis not present

## 2018-05-16 DIAGNOSIS — E119 Type 2 diabetes mellitus without complications: Secondary | ICD-10-CM | POA: Diagnosis not present

## 2018-05-16 DIAGNOSIS — R600 Localized edema: Secondary | ICD-10-CM | POA: Diagnosis not present

## 2018-05-16 DIAGNOSIS — E785 Hyperlipidemia, unspecified: Secondary | ICD-10-CM | POA: Insufficient documentation

## 2018-05-16 DIAGNOSIS — I110 Hypertensive heart disease with heart failure: Secondary | ICD-10-CM | POA: Diagnosis not present

## 2018-06-09 ENCOUNTER — Other Ambulatory Visit: Payer: Self-pay | Admitting: Internal Medicine

## 2018-07-10 ENCOUNTER — Other Ambulatory Visit: Payer: Self-pay | Admitting: Internal Medicine

## 2018-09-06 ENCOUNTER — Other Ambulatory Visit: Payer: Self-pay | Admitting: Internal Medicine

## 2018-09-24 ENCOUNTER — Encounter: Payer: Self-pay | Admitting: Internal Medicine

## 2018-09-24 ENCOUNTER — Ambulatory Visit (INDEPENDENT_AMBULATORY_CARE_PROVIDER_SITE_OTHER): Payer: Medicare Other | Admitting: Internal Medicine

## 2018-09-24 ENCOUNTER — Other Ambulatory Visit: Payer: Self-pay | Admitting: Internal Medicine

## 2018-09-24 ENCOUNTER — Other Ambulatory Visit (INDEPENDENT_AMBULATORY_CARE_PROVIDER_SITE_OTHER): Payer: Medicare Other

## 2018-09-24 VITALS — BP 118/76 | HR 53 | Temp 97.8°F | Ht 69.0 in | Wt 206.0 lb

## 2018-09-24 DIAGNOSIS — R972 Elevated prostate specific antigen [PSA]: Secondary | ICD-10-CM

## 2018-09-24 DIAGNOSIS — I10 Essential (primary) hypertension: Secondary | ICD-10-CM

## 2018-09-24 DIAGNOSIS — E119 Type 2 diabetes mellitus without complications: Secondary | ICD-10-CM | POA: Diagnosis not present

## 2018-09-24 DIAGNOSIS — E785 Hyperlipidemia, unspecified: Secondary | ICD-10-CM | POA: Diagnosis not present

## 2018-09-24 DIAGNOSIS — Z23 Encounter for immunization: Secondary | ICD-10-CM | POA: Diagnosis not present

## 2018-09-24 LAB — LIPID PANEL
CHOL/HDL RATIO: 4
CHOLESTEROL: 163 mg/dL (ref 0–200)
HDL: 40 mg/dL (ref >39.00)
LDL CALC: 92 mg/dL (ref 0–99)
NonHDL: 122.64
Triglycerides: 151 mg/dL — ABNORMAL HIGH (ref 0.0–149.0)
VLDL: 30.2 mg/dL (ref 0.0–40.0)

## 2018-09-24 LAB — BASIC METABOLIC PANEL
BUN: 12 mg/dL (ref 6–23)
CHLORIDE: 103 meq/L (ref 96–112)
CO2: 29 meq/L (ref 19–32)
Calcium: 9.2 mg/dL (ref 8.4–10.5)
Creatinine, Ser: 1.6 mg/dL — ABNORMAL HIGH (ref 0.40–1.50)
GFR: 55.4 mL/min — ABNORMAL LOW (ref >60.00)
GLUCOSE: 117 mg/dL — AB (ref 70–99)
POTASSIUM: 4.3 meq/L (ref 3.5–5.1)
Sodium: 138 mEq/L (ref 135–145)

## 2018-09-24 LAB — HEPATIC FUNCTION PANEL
ALT: 22 U/L (ref 0–53)
AST: 27 U/L (ref 0–37)
Albumin: 4.3 g/dL (ref 3.5–5.2)
Alkaline Phosphatase: 103 U/L (ref 39–117)
BILIRUBIN DIRECT: 0.1 mg/dL (ref 0.0–0.3)
BILIRUBIN TOTAL: 0.7 mg/dL (ref 0.2–1.2)
TOTAL PROTEIN: 7.6 g/dL (ref 6.0–8.3)

## 2018-09-24 LAB — PSA: PSA: 10.44 ng/mL — ABNORMAL HIGH (ref 0.10–4.00)

## 2018-09-24 LAB — HEMOGLOBIN A1C: HEMOGLOBIN A1C: 7.5 % — AB (ref 4.6–6.5)

## 2018-09-24 MED ORDER — EMPAGLIFLOZIN 10 MG PO TABS: 10.0000 mg | ORAL_TABLET | Freq: Every day | ORAL | 3 refills | Status: DC

## 2018-09-24 NOTE — Patient Instructions (Addendum)
Please continue all other medications as before, and refills have been done if requested.  Please have the pharmacy call with any other refills you may need.  Please continue your efforts at being more active, low cholesterol diet, and weight control..  Please keep your appointments with your specialists as you may have planned  You will be contacted regarding the referral for: urology at [facility] per you request  Please go to the LAB in the Basement (turn left off the elevator) for the tests to be done today  You will be contacted by phone if any changes need to be made immediately.  Otherwise, you will receive a letter about your results with an explanation, but please check with MyChart first.  Please remember to sign up for MyChart if you have not done so, as this will be important to you in the future with finding out test results, communicating by private email, and scheduling acute appointments online when needed.  Please return in 6 months, or sooner if needed, with Lab testing done 3-5 days before
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
## 2018-09-25 ENCOUNTER — Telehealth: Payer: Self-pay

## 2018-09-25 NOTE — Assessment & Plan Note (Signed)
Asympt, for referral urology, recheck psa

## 2018-09-25 NOTE — Assessment & Plan Note (Signed)
stable overall by history and exam, recent data reviewed with pt, and pt to continue medical treatment as before,  to f/u any worsening symptoms or concerns  

## 2018-09-25 NOTE — Telephone Encounter (Signed)
-----   Message from Biagio Borg, MD sent at 09/24/2018  8:33 PM EDT ----- Left message on MyChart, pt to cont same tx except  The test results show that your current treatment is OK, except the A1c is too high.  We should add a low dose of medication called Jardiance 10 mg per day, as we are not able to start other less expensive medication due to the kidney being slowed (but this was stable though compared to before).  I will send the prescription, and you should hear from the office as well.  Please have the pharmacy call with the name of a similar medication if Vania Rea is not covered by your insurance.  Calvin Wilcox to please inform pts wife who was with him today as pt may not understand, and I will do rx

## 2018-09-25 NOTE — Assessment & Plan Note (Signed)
stable overall by history and exam, recent data reviewed with pt, and pt to continue medical treatment as before,  to f/u any worsening symptoms or concerns, for f/u lab 

## 2018-09-25 NOTE — Telephone Encounter (Signed)
Pt has been informed of results and expressed understanding.  °

## 2018-10-06 ENCOUNTER — Encounter: Payer: Self-pay | Admitting: Internal Medicine

## 2019-01-18 ENCOUNTER — Other Ambulatory Visit: Payer: Self-pay | Admitting: Internal Medicine

## 2019-03-11 ENCOUNTER — Other Ambulatory Visit: Payer: Self-pay | Admitting: Internal Medicine

## 2019-03-12 ENCOUNTER — Other Ambulatory Visit: Payer: Self-pay | Admitting: Internal Medicine

## 2019-03-26 ENCOUNTER — Ambulatory Visit: Payer: Medicare Other | Admitting: Internal Medicine

## 2019-04-06 ENCOUNTER — Other Ambulatory Visit: Payer: Self-pay | Admitting: Internal Medicine

## 2019-05-18 ENCOUNTER — Other Ambulatory Visit (INDEPENDENT_AMBULATORY_CARE_PROVIDER_SITE_OTHER): Payer: Medicare Other

## 2019-05-18 ENCOUNTER — Other Ambulatory Visit: Payer: Self-pay

## 2019-05-18 ENCOUNTER — Encounter: Payer: Self-pay | Admitting: Internal Medicine

## 2019-05-18 ENCOUNTER — Ambulatory Visit (INDEPENDENT_AMBULATORY_CARE_PROVIDER_SITE_OTHER): Payer: Medicare Other | Admitting: Internal Medicine

## 2019-05-18 VITALS — BP 110/80 | HR 64 | Temp 98.7°F | Ht 69.0 in | Wt 202.0 lb

## 2019-05-18 DIAGNOSIS — E538 Deficiency of other specified B group vitamins: Secondary | ICD-10-CM

## 2019-05-18 DIAGNOSIS — E611 Iron deficiency: Secondary | ICD-10-CM | POA: Diagnosis not present

## 2019-05-18 DIAGNOSIS — Z0000 Encounter for general adult medical examination without abnormal findings: Secondary | ICD-10-CM

## 2019-05-18 DIAGNOSIS — E119 Type 2 diabetes mellitus without complications: Secondary | ICD-10-CM

## 2019-05-18 DIAGNOSIS — E559 Vitamin D deficiency, unspecified: Secondary | ICD-10-CM

## 2019-05-18 DIAGNOSIS — R972 Elevated prostate specific antigen [PSA]: Secondary | ICD-10-CM

## 2019-05-18 DIAGNOSIS — N183 Chronic kidney disease, stage 3 unspecified: Secondary | ICD-10-CM

## 2019-05-18 LAB — CBC WITH DIFFERENTIAL/PLATELET
Basophils Absolute: 0.1 10*3/uL (ref 0.0–0.1)
Basophils Relative: 1.3 % (ref 0.0–3.0)
Eosinophils Absolute: 0.2 10*3/uL (ref 0.0–0.7)
Eosinophils Relative: 3.2 % (ref 0.0–5.0)
HCT: 41.7 % (ref 39.0–52.0)
Hemoglobin: 13.7 g/dL (ref 13.0–17.0)
Lymphocytes Relative: 39.5 % (ref 12.0–46.0)
Lymphs Abs: 2.6 10*3/uL (ref 0.7–4.0)
MCHC: 33 g/dL (ref 30.0–36.0)
MCV: 83.7 fl (ref 78.0–100.0)
Monocytes Absolute: 0.6 10*3/uL (ref 0.1–1.0)
Monocytes Relative: 9 % (ref 3.0–12.0)
Neutro Abs: 3 10*3/uL (ref 1.4–7.7)
Neutrophils Relative %: 47 % (ref 43.0–77.0)
Platelets: 341 10*3/uL (ref 150.0–400.0)
RBC: 4.98 Mil/uL (ref 4.22–5.81)
RDW: 15.5 % (ref 11.5–15.5)
WBC: 6.5 10*3/uL (ref 4.0–10.5)

## 2019-05-18 LAB — HEMOGLOBIN A1C: Hgb A1c MFr Bld: 7.9 % — ABNORMAL HIGH (ref 4.6–6.5)

## 2019-05-18 MED ORDER — AMLODIPINE BESYLATE 5 MG PO TABS: ORAL_TABLET | ORAL | 3 refills | Status: DC

## 2019-05-18 MED ORDER — OMEPRAZOLE 20 MG PO CPDR: DELAYED_RELEASE_CAPSULE | ORAL | 3 refills | Status: DC

## 2019-05-18 MED ORDER — PRAMIPEXOLE DIHYDROCHLORIDE 0.25 MG PO TABS: ORAL_TABLET | ORAL | 3 refills | Status: DC

## 2019-05-18 MED ORDER — TRIAMTERENE-HCTZ 37.5-25 MG PO TABS: 1.0000 | ORAL_TABLET | Freq: Every day | ORAL | 3 refills | Status: DC

## 2019-05-18 MED ORDER — ATORVASTATIN CALCIUM 40 MG PO TABS: 40.0000 mg | ORAL_TABLET | Freq: Every day | ORAL | 3 refills | Status: DC

## 2019-05-18 MED ORDER — TAMSULOSIN HCL 0.4 MG PO CAPS: ORAL_CAPSULE | ORAL | 3 refills | Status: DC

## 2019-05-18 NOTE — Assessment & Plan Note (Signed)
Stable, to fu urology as planned 
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
## 2019-05-18 NOTE — Assessment & Plan Note (Signed)

## 2019-05-18 NOTE — Assessment & Plan Note (Signed)
stable overall by history and exam, recent data reviewed with pt, and pt to continue medical treatment as before,  to f/u any worsening symptoms or concerns  

## 2019-05-18 NOTE — Assessment & Plan Note (Signed)
?   Control, for a1c, consider add glipizide ER

## 2019-05-18 NOTE — Patient Instructions (Signed)
Please remember to call about Urology follow up appt.  Please continue all other medications as before, and refills have been done if requested.  Please have the pharmacy call with any other refills you may need.  Please continue your efforts at being more active, low cholesterol diet, and weight control.  You are otherwise up to date with prevention measures today.  Please keep your appointments with your specialists as you may have planned  Please go to the LAB in the Basement (turn left off the elevator) for the tests to be done today  You will be contacted by phone if any changes need to be made immediately.  Otherwise, you will receive a letter about your results with an explanation, but please check with MyChart first.  Please remember to sign up for MyChart if you have not done so, as this will be important to you in the future with finding out test results, communicating by private email, and scheduling acute appointments online when needed.  Please return in 6 months, or sooner if needed, with Lab testing done 3-5 days before

## 2019-05-19 ENCOUNTER — Other Ambulatory Visit: Payer: Self-pay | Admitting: Internal Medicine

## 2019-05-19 LAB — HEPATIC FUNCTION PANEL
ALT: 23 U/L (ref 0–53)
AST: 30 U/L (ref 0–37)
Albumin: 4 g/dL (ref 3.5–5.2)
Alkaline Phosphatase: 85 U/L (ref 39–117)
Bilirubin, Direct: 0.1 mg/dL (ref 0.0–0.3)
Total Bilirubin: 0.5 mg/dL (ref 0.2–1.2)
Total Protein: 7.2 g/dL (ref 6.0–8.3)

## 2019-05-19 LAB — BASIC METABOLIC PANEL
BUN: 20 mg/dL (ref 6–23)
CO2: 29 mEq/L (ref 19–32)
Calcium: 9.1 mg/dL (ref 8.4–10.5)
Chloride: 100 mEq/L (ref 96–112)
Creatinine, Ser: 1.9 mg/dL — ABNORMAL HIGH (ref 0.40–1.50)
GFR: 42.67 mL/min — ABNORMAL LOW (ref >60.00)
Glucose, Bld: 135 mg/dL — ABNORMAL HIGH (ref 70–99)
Potassium: 4.2 mEq/L (ref 3.5–5.1)
Sodium: 137 mEq/L (ref 135–145)

## 2019-05-19 LAB — VITAMIN B12: Vitamin B-12: 179 pg/mL — ABNORMAL LOW (ref 211–911)

## 2019-05-19 LAB — LIPID PANEL
Cholesterol: 174 mg/dL (ref 0–200)
HDL: 36.8 mg/dL — ABNORMAL LOW (ref >39.00)
NonHDL: 136.76
Total CHOL/HDL Ratio: 5
Triglycerides: 225 mg/dL — ABNORMAL HIGH (ref 0.0–149.0)
VLDL: 45 mg/dL — ABNORMAL HIGH (ref 0.0–40.0)

## 2019-05-19 LAB — TSH: TSH: 1.27 u[IU]/mL (ref 0.35–4.50)

## 2019-05-19 LAB — IBC PANEL
Iron: 36 ug/dL — ABNORMAL LOW (ref 42–165)
Saturation Ratios: 11.6 % — ABNORMAL LOW (ref 20.0–50.0)
Transferrin: 221 mg/dL (ref 212.0–360.0)

## 2019-05-19 LAB — LDL CHOLESTEROL, DIRECT: Direct LDL: 106 mg/dL

## 2019-05-19 LAB — VITAMIN D 25 HYDROXY (VIT D DEFICIENCY, FRACTURES): VITD: 32.16 ng/mL (ref 30.00–100.00)

## 2019-05-19 MED ORDER — GLIPIZIDE ER 2.5 MG PO TB24: 2.5000 mg | ORAL_TABLET | Freq: Every day | ORAL | 3 refills | Status: DC

## 2019-05-20 ENCOUNTER — Ambulatory Visit (INDEPENDENT_AMBULATORY_CARE_PROVIDER_SITE_OTHER): Payer: Medicare Other

## 2019-05-20 ENCOUNTER — Telehealth: Payer: Self-pay

## 2019-05-20 DIAGNOSIS — E538 Deficiency of other specified B group vitamins: Secondary | ICD-10-CM | POA: Diagnosis not present

## 2019-05-20 MED ORDER — CYANOCOBALAMIN 1000 MCG/ML IJ SOLN
1000.0000 ug | Freq: Once | INTRAMUSCULAR | Status: AC
  Administered 2019-05-20: 1000 ug via INTRAMUSCULAR

## 2019-05-20 NOTE — Progress Notes (Signed)
Medical screening examination/treatment/procedure(s) were performed by non-physician practitioner and as supervising physician I was immediately available for consultation/collaboration. I agree with above. James John, MD   

## 2019-05-20 NOTE — Telephone Encounter (Signed)
-----   Message from Biagio Borg, MD sent at 05/19/2019  6:40 PM EDT ----- Left message on MyChart, pt to cont same tx except  The test results show that your current treatment is OK, except the kidneys are somewhat slower than before, the A1c is too high, and the Vitamin B12 level is low.  We need to: 1) make sure to drink enough fluids every day  2)  Start glipizide ER 2.5 mg per day 3)  Start monthly B12 shots for now, and we may be able to change to pills in the future  Ader Fritze to please inform pt, I will do rx, and pt needs monthly b12

## 2019-05-20 NOTE — Telephone Encounter (Signed)
Pt has been informed of results and expressed understanding.  °

## 2019-07-09 ENCOUNTER — Ambulatory Visit (INDEPENDENT_AMBULATORY_CARE_PROVIDER_SITE_OTHER): Payer: Medicare Other

## 2019-07-09 DIAGNOSIS — E538 Deficiency of other specified B group vitamins: Secondary | ICD-10-CM | POA: Diagnosis not present

## 2019-07-09 MED ORDER — CYANOCOBALAMIN 1000 MCG/ML IJ SOLN
1000.0000 ug | Freq: Once | INTRAMUSCULAR | Status: AC
  Administered 2019-07-09: 1000 ug via INTRAMUSCULAR

## 2019-07-09 NOTE — Progress Notes (Signed)
Medical screening examination/treatment/procedure(s) were performed by non-physician practitioner and as supervising physician I was immediately available for consultation/collaboration. I agree with above. James John, MD   

## 2019-08-07 ENCOUNTER — Ambulatory Visit: Payer: Medicare Other

## 2019-08-07 DIAGNOSIS — E538 Deficiency of other specified B group vitamins: Secondary | ICD-10-CM

## 2019-08-07 MED ORDER — CYANOCOBALAMIN 1000 MCG/ML IJ SOLN: 1000.0000 ug | Freq: Once | INTRAMUSCULAR | Status: AC

## 2019-08-07 NOTE — Progress Notes (Signed)
Medical screening examination/treatment/procedure(s) were performed by non-physician practitioner and as supervising physician I was immediately available for consultation/collaboration. I agree with above. Taylon Coole, MD   

## 2019-08-24 ENCOUNTER — Other Ambulatory Visit: Payer: Self-pay

## 2019-08-24 DIAGNOSIS — Z20822 Contact with and (suspected) exposure to covid-19: Secondary | ICD-10-CM

## 2019-08-24 DIAGNOSIS — R6889 Other general symptoms and signs: Secondary | ICD-10-CM | POA: Diagnosis not present

## 2019-08-25 LAB — NOVEL CORONAVIRUS, NAA: SARS-CoV-2, NAA: NOT DETECTED

## 2019-08-26 ENCOUNTER — Telehealth: Payer: Self-pay | Admitting: Hematology

## 2019-08-26 NOTE — Telephone Encounter (Signed)
Pt is aware covid 19 test is negative °

## 2019-09-04 ENCOUNTER — Ambulatory Visit (INDEPENDENT_AMBULATORY_CARE_PROVIDER_SITE_OTHER): Payer: Medicare Other

## 2019-09-04 ENCOUNTER — Other Ambulatory Visit: Payer: Self-pay

## 2019-09-04 DIAGNOSIS — E538 Deficiency of other specified B group vitamins: Secondary | ICD-10-CM

## 2019-09-04 DIAGNOSIS — Z23 Encounter for immunization: Secondary | ICD-10-CM

## 2019-09-04 MED ORDER — CYANOCOBALAMIN 1000 MCG/ML IJ SOLN
1000.0000 ug | Freq: Once | INTRAMUSCULAR | Status: AC
  Administered 2019-09-04: 1000 ug via INTRAMUSCULAR

## 2019-09-04 NOTE — Progress Notes (Signed)
Patient ID: Calvin Wilcox, male   DOB: 10/11/1951, 68 y.o.   MRN: [id number]  Medical screening examination/treatment/procedure(s) were performed by non-physician practitioner and as supervising physician I was immediately available for consultation/collaboration. I agree with above. Cathlean Cower, MD

## 2019-09-16 ENCOUNTER — Other Ambulatory Visit: Payer: Self-pay

## 2019-09-16 DIAGNOSIS — Z20822 Contact with and (suspected) exposure to covid-19: Secondary | ICD-10-CM

## 2019-09-18 LAB — NOVEL CORONAVIRUS, NAA: SARS-CoV-2, NAA: NOT DETECTED

## 2019-10-07 ENCOUNTER — Ambulatory Visit (INDEPENDENT_AMBULATORY_CARE_PROVIDER_SITE_OTHER): Payer: Medicare Other

## 2019-10-07 DIAGNOSIS — E538 Deficiency of other specified B group vitamins: Secondary | ICD-10-CM

## 2019-10-07 MED ORDER — CYANOCOBALAMIN 1000 MCG/ML IJ SOLN
1000.0000 ug | Freq: Once | INTRAMUSCULAR | Status: AC
  Administered 2019-10-07: 10:00:00 1000 ug via INTRAMUSCULAR

## 2019-10-07 NOTE — Progress Notes (Signed)
Medical screening examination/treatment/procedure(s) were performed by non-physician practitioner and as supervising physician I was immediately available for consultation/collaboration. I agree with above. Bren Steers, MD   

## 2019-10-09 ENCOUNTER — Ambulatory Visit: Payer: Medicare Other

## 2019-11-04 ENCOUNTER — Ambulatory Visit (INDEPENDENT_AMBULATORY_CARE_PROVIDER_SITE_OTHER): Payer: Medicare Other

## 2019-11-04 ENCOUNTER — Other Ambulatory Visit: Payer: Self-pay

## 2019-11-04 DIAGNOSIS — E538 Deficiency of other specified B group vitamins: Secondary | ICD-10-CM | POA: Diagnosis not present

## 2019-11-04 MED ORDER — CYANOCOBALAMIN 1000 MCG/ML IJ SOLN
1000.0000 ug | Freq: Once | INTRAMUSCULAR | Status: AC
  Administered 2019-11-04: 1000 ug via INTRAMUSCULAR

## 2019-11-04 NOTE — Progress Notes (Signed)
Medical screening examination/treatment/procedure(s) were performed by non-physician practitioner and as supervising physician I was immediately available for consultation/collaboration. I agree with above. James John, MD   

## 2019-11-17 ENCOUNTER — Ambulatory Visit: Payer: Medicare Other | Admitting: Internal Medicine

## 2019-11-19 ENCOUNTER — Other Ambulatory Visit (INDEPENDENT_AMBULATORY_CARE_PROVIDER_SITE_OTHER): Payer: Medicare Other

## 2019-11-19 ENCOUNTER — Other Ambulatory Visit: Payer: Self-pay

## 2019-11-19 ENCOUNTER — Encounter: Payer: Self-pay | Admitting: Internal Medicine

## 2019-11-19 ENCOUNTER — Ambulatory Visit (INDEPENDENT_AMBULATORY_CARE_PROVIDER_SITE_OTHER): Payer: Medicare Other | Admitting: Internal Medicine

## 2019-11-19 VITALS — BP 126/84 | HR 77 | Temp 98.1°F | Ht 69.0 in | Wt 205.0 lb

## 2019-11-19 DIAGNOSIS — E538 Deficiency of other specified B group vitamins: Secondary | ICD-10-CM | POA: Insufficient documentation

## 2019-11-19 DIAGNOSIS — Z23 Encounter for immunization: Secondary | ICD-10-CM

## 2019-11-19 DIAGNOSIS — E119 Type 2 diabetes mellitus without complications: Secondary | ICD-10-CM | POA: Diagnosis not present

## 2019-11-19 DIAGNOSIS — N183 Chronic kidney disease, stage 3 unspecified: Secondary | ICD-10-CM | POA: Diagnosis not present

## 2019-11-19 DIAGNOSIS — E611 Iron deficiency: Secondary | ICD-10-CM | POA: Diagnosis not present

## 2019-11-19 DIAGNOSIS — I10 Essential (primary) hypertension: Secondary | ICD-10-CM

## 2019-11-19 DIAGNOSIS — E785 Hyperlipidemia, unspecified: Secondary | ICD-10-CM

## 2019-11-19 LAB — BASIC METABOLIC PANEL
BUN: 20 mg/dL (ref 6–23)
CO2: 30 mEq/L (ref 19–32)
Calcium: 9.3 mg/dL (ref 8.4–10.5)
Chloride: 97 mEq/L (ref 96–112)
Creatinine, Ser: 1.97 mg/dL — ABNORMAL HIGH (ref 0.40–1.50)
GFR: 40.86 mL/min — ABNORMAL LOW (ref >60.00)
Glucose, Bld: 97 mg/dL (ref 70–99)
Potassium: 4 mEq/L (ref 3.5–5.1)
Sodium: 135 mEq/L (ref 135–145)

## 2019-11-19 LAB — CBC WITH DIFFERENTIAL/PLATELET
Basophils Absolute: 0.1 10*3/uL (ref 0.0–0.1)
Basophils Relative: 1.2 % (ref 0.0–3.0)
Eosinophils Absolute: 0.2 10*3/uL (ref 0.0–0.7)
Eosinophils Relative: 2.6 % (ref 0.0–5.0)
HCT: 44.8 % (ref 39.0–52.0)
Hemoglobin: 14.7 g/dL (ref 13.0–17.0)
Lymphocytes Relative: 38.2 % (ref 12.0–46.0)
Lymphs Abs: 2.9 10*3/uL (ref 0.7–4.0)
MCHC: 32.8 g/dL (ref 30.0–36.0)
MCV: 83 fl (ref 78.0–100.0)
Monocytes Absolute: 0.8 10*3/uL (ref 0.1–1.0)
Monocytes Relative: 10.9 % (ref 3.0–12.0)
Neutro Abs: 3.6 10*3/uL (ref 1.4–7.7)
Neutrophils Relative %: 47.1 % (ref 43.0–77.0)
Platelets: 332 10*3/uL (ref 150.0–400.0)
RBC: 5.4 Mil/uL (ref 4.22–5.81)
RDW: 16.8 % — ABNORMAL HIGH (ref 11.5–15.5)
WBC: 7.6 10*3/uL (ref 4.0–10.5)

## 2019-11-19 LAB — LIPID PANEL
Cholesterol: 185 mg/dL (ref 0–200)
HDL: 51 mg/dL (ref >39.00)
LDL Cholesterol: 104 mg/dL — ABNORMAL HIGH (ref 0–99)
NonHDL: 134.25
Total CHOL/HDL Ratio: 4
Triglycerides: 153 mg/dL — ABNORMAL HIGH (ref 0.0–149.0)
VLDL: 30.6 mg/dL (ref 0.0–40.0)

## 2019-11-19 LAB — MICROALBUMIN / CREATININE URINE RATIO
Creatinine,U: 163.7 mg/dL
Microalb Creat Ratio: 2.3 mg/g (ref 0.0–30.0)
Microalb, Ur: 3.8 mg/dL — ABNORMAL HIGH (ref 0.0–1.9)

## 2019-11-19 LAB — IBC PANEL
Iron: 28 ug/dL — ABNORMAL LOW (ref 42–165)
Saturation Ratios: 8.2 % — ABNORMAL LOW (ref 20.0–50.0)
Transferrin: 244 mg/dL (ref 212.0–360.0)

## 2019-11-19 LAB — HEMOGLOBIN A1C: Hgb A1c MFr Bld: 6.7 % — ABNORMAL HIGH (ref 4.6–6.5)

## 2019-11-19 LAB — HEPATIC FUNCTION PANEL
ALT: 25 U/L (ref 0–53)
AST: 29 U/L (ref 0–37)
Albumin: 4.6 g/dL (ref 3.5–5.2)
Alkaline Phosphatase: 83 U/L (ref 39–117)
Bilirubin, Direct: 0.1 mg/dL (ref 0.0–0.3)
Total Bilirubin: 0.7 mg/dL (ref 0.2–1.2)
Total Protein: 8.6 g/dL — ABNORMAL HIGH (ref 6.0–8.3)

## 2019-11-19 LAB — FERRITIN: Ferritin: 122.8 ng/mL (ref 22.0–322.0)

## 2019-11-19 MED ORDER — ATORVASTATIN CALCIUM 40 MG PO TABS: 40.0000 mg | ORAL_TABLET | Freq: Every day | ORAL | 3 refills | Status: DC

## 2019-11-19 MED ORDER — OMEPRAZOLE 20 MG PO CPDR: DELAYED_RELEASE_CAPSULE | ORAL | 3 refills | Status: DC

## 2019-11-19 MED ORDER — AMLODIPINE BESYLATE 5 MG PO TABS: ORAL_TABLET | ORAL | 3 refills | Status: DC

## 2019-11-19 MED ORDER — JARDIANCE 10 MG PO TABS: 10.0000 mg | ORAL_TABLET | Freq: Every day | ORAL | 3 refills | Status: DC

## 2019-11-19 MED ORDER — METHOCARBAMOL 500 MG PO TABS: 500.0000 mg | ORAL_TABLET | Freq: Three times a day (TID) | ORAL | 0 refills | Status: DC | PRN

## 2019-11-19 MED ORDER — TRIAMTERENE-HCTZ 37.5-25 MG PO TABS: 1.0000 | ORAL_TABLET | Freq: Every day | ORAL | 3 refills | Status: DC

## 2019-11-19 MED ORDER — TAMSULOSIN HCL 0.4 MG PO CAPS: ORAL_CAPSULE | ORAL | 3 refills | Status: DC

## 2019-11-19 MED ORDER — POLYSACCHARIDE IRON COMPLEX 150 MG PO CAPS: 150.0000 mg | ORAL_CAPSULE | Freq: Every day | ORAL | 1 refills | Status: AC

## 2019-11-19 MED ORDER — CYANOCOBALAMIN 1000 MCG/ML IJ SOLN
1000.0000 ug | Freq: Once | INTRAMUSCULAR | Status: AC
  Administered 2019-11-19: 1000 ug via INTRAMUSCULAR

## 2019-11-19 MED ORDER — GLIPIZIDE ER 2.5 MG PO TB24: 2.5000 mg | ORAL_TABLET | Freq: Every day | ORAL | 3 refills | Status: DC

## 2019-11-19 NOTE — Patient Instructions (Addendum)
You had the Tdap tetanus shot today, and the last B12 shot  We can hold on more B12 shots after today  Please take all new medication as prescribed - the nu-iron pill at one per day for 6 months  Ok to stay off the glipizide for now as you are not taking it  Please continue all other medications as before, and refills have been done if requested.  Please have the pharmacy call with any other refills you may need.  Please continue your efforts at being more active, low cholesterol diet, and weight control.  Please keep your appointments with your specialists as you may have planned  You will be contacted regarding the referral for: Gastroenterology due the low iron  Please go to the LAB in the Basement (turn left off the elevator) for the tests to be done today  You will be contacted by phone if any changes need to be made immediately.  Otherwise, you will receive a letter about your results with an explanation, but please check with MyChart first.  Please remember to sign up for MyChart if you have not done so, as this will be important to you in the future with finding out test results, communicating by private email, and scheduling acute appointments online when needed.  Please return in 6 months, or sooner if needed
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
## 2019-11-22 ENCOUNTER — Encounter: Payer: Self-pay | Admitting: Internal Medicine

## 2019-11-22 NOTE — Assessment & Plan Note (Signed)
Pt states not notified from last visit; for nu iron 1 qd, recheck labs, refer GI

## 2019-11-22 NOTE — Assessment & Plan Note (Signed)
Not taking the glipizide, ok to continue current meds, for a1c

## 2019-11-22 NOTE — Assessment & Plan Note (Signed)
stable overall by history and exam, recent data reviewed with pt, and pt to continue medical treatment as before,  to f/u any worsening symptoms or concerns  

## 2019-11-22 NOTE — Assessment & Plan Note (Signed)
stable overall by history and exam, recent data reviewed with pt, and pt to continue medical treatment as before,  to f/u any worsening symptoms or concerns, for lipids with labs 

## 2019-11-22 NOTE — Assessment & Plan Note (Addendum)
For b12 1000 mg IM today  Note:  Total time for pt hx, exam, review of record with pt in the room, determination of diagnoses and plan for further eval and tx is > 40 min, with over 50% spent in coordination and counseling of patient including the differential dx, tx, further evaluation and other management of b12 deficiency, Iron deficiency, CKD, DM and HTN

## 2019-11-25 ENCOUNTER — Encounter: Payer: Self-pay | Admitting: Physician Assistant

## 2019-12-02 ENCOUNTER — Ambulatory Visit: Payer: Medicare Other

## 2019-12-17 ENCOUNTER — Ambulatory Visit: Payer: Medicare Other | Admitting: Physician Assistant

## 2019-12-17 ENCOUNTER — Ambulatory Visit (INDEPENDENT_AMBULATORY_CARE_PROVIDER_SITE_OTHER): Payer: Medicare Other

## 2019-12-17 ENCOUNTER — Encounter: Payer: Self-pay | Admitting: Physician Assistant

## 2019-12-17 VITALS — BP 110/60 | HR 64 | Temp 96.4°F | Ht 69.0 in | Wt 208.2 lb

## 2019-12-17 DIAGNOSIS — D509 Iron deficiency anemia, unspecified: Secondary | ICD-10-CM

## 2019-12-17 DIAGNOSIS — Z1159 Encounter for screening for other viral diseases: Secondary | ICD-10-CM

## 2019-12-17 MED ORDER — NA SULFATE-K SULFATE-MG SULF 17.5-3.13-1.6 GM/177ML PO SOLN: 1.0000 | Freq: Once | ORAL | 0 refills | Status: AC
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
## 2019-12-17 NOTE — Patient Instructions (Addendum)
If you are age 71 or older, your body mass index should be between 23-30. Your Body mass index is 30.75 kg/m. If this is out of the aforementioned range listed, please consider follow up with your Primary Care Provider.  If you are age 71 or younger, your body mass index should be between 19-25. Your Body mass index is 30.75 kg/m. If this is out of the aformentioned range listed, please consider follow up with your Primary Care Provider.   You have been scheduled for an endoscopy and colonoscopy. Please follow the written instructions given to you at your visit today. Please pick up your prep supplies at the pharmacy within the next 1-3 days. If you use inhalers (even only as needed), please bring them with you on the day of your procedure.

## 2019-12-17 NOTE — Progress Notes (Signed)
Reviewed and agree with management plan.  Ayzia Day T. Lynee Rosenbach, MD FACG [facility]  

## 2019-12-18 ENCOUNTER — Telehealth: Payer: Self-pay | Admitting: Gastroenterology

## 2019-12-18 LAB — SARS CORONAVIRUS 2 (TAT 6-24 HRS): SARS Coronavirus 2: NEGATIVE

## 2019-12-18 NOTE — Telephone Encounter (Signed)
Hi Dr. Fuller Plan, this pt just cancelled his procedure that was scheduled on 12/22/19 at [facility] because he is afraid of being put to sleep. Thank you.

## 2019-12-18 NOTE — Telephone Encounter (Signed)
I called and spoke with the patient.  He has agreed to rescheduled.  His case has been rescheduled for the same date and time.

## 2019-12-18 NOTE — Telephone Encounter (Signed)
Please contact this patient to stress importance of further evaluation of new iron deficiency and assess his concerns about the procedures and sedation.

## 2019-12-22 ENCOUNTER — Encounter: Payer: Self-pay | Admitting: Gastroenterology

## 2019-12-22 ENCOUNTER — Other Ambulatory Visit: Payer: Self-pay

## 2019-12-22 ENCOUNTER — Ambulatory Visit (AMBULATORY_SURGERY_CENTER): Payer: Medicare Other | Admitting: Gastroenterology

## 2019-12-22 VITALS — BP 110/78 | HR 54 | Temp 97.9°F | Resp 18 | Ht 69.0 in | Wt 208.0 lb

## 2019-12-22 DIAGNOSIS — K222 Esophageal obstruction: Secondary | ICD-10-CM

## 2019-12-22 DIAGNOSIS — D124 Benign neoplasm of descending colon: Secondary | ICD-10-CM

## 2019-12-22 DIAGNOSIS — K5730 Diverticulosis of large intestine without perforation or abscess without bleeding: Secondary | ICD-10-CM

## 2019-12-22 DIAGNOSIS — K640 First degree hemorrhoids: Secondary | ICD-10-CM | POA: Diagnosis not present

## 2019-12-22 DIAGNOSIS — K219 Gastro-esophageal reflux disease without esophagitis: Secondary | ICD-10-CM | POA: Diagnosis not present

## 2019-12-22 DIAGNOSIS — D508 Other iron deficiency anemias: Secondary | ICD-10-CM

## 2019-12-22 DIAGNOSIS — D123 Benign neoplasm of transverse colon: Secondary | ICD-10-CM | POA: Diagnosis not present

## 2019-12-22 DIAGNOSIS — B9681 Helicobacter pylori [H. pylori] as the cause of diseases classified elsewhere: Secondary | ICD-10-CM

## 2019-12-22 DIAGNOSIS — Z1211 Encounter for screening for malignant neoplasm of colon: Secondary | ICD-10-CM | POA: Diagnosis not present

## 2019-12-22 DIAGNOSIS — K2950 Unspecified chronic gastritis without bleeding: Secondary | ICD-10-CM | POA: Diagnosis not present

## 2019-12-22 MED ORDER — SODIUM CHLORIDE 0.9 % IV SOLN: 500.0000 mL | Freq: Once | INTRAVENOUS | Status: DC

## 2019-12-22 NOTE — Patient Instructions (Signed)
HANDOUTS given for hemorrhoids, polyps, diverticulosis, hiatal hernia, GERD, and gastritis.  YOU HAD AN ENDOSCOPIC PROCEDURE TODAY AT [facility]:   Refer to the procedure report that was given to you for any specific questions about what was found during the examination.  If the procedure report does not answer your questions, please call your gastroenterologist to clarify.  If you requested that your care partner not be given the details of your procedure findings, then the procedure report has been included in a sealed envelope for you to review at your convenience later.  YOU SHOULD EXPECT: Some feelings of bloating in the abdomen. Passage of more gas than usual.  Walking can help get rid of the air that was put into your GI tract during the procedure and reduce the bloating. If you had a lower endoscopy (such as a colonoscopy or flexible sigmoidoscopy) you may notice spotting of blood in your stool or on the toilet paper. If you underwent a bowel prep for your procedure, you may not have a normal bowel movement for a few days.  Please Note:  You might notice some irritation and congestion in your nose or some drainage.  This is from the oxygen used during your procedure.  There is no need for concern and it should clear up in a day or so.  SYMPTOMS TO REPORT IMMEDIATELY:   Following lower endoscopy (colonoscopy or flexible sigmoidoscopy):  Excessive amounts of blood in the stool  Significant tenderness or worsening of abdominal pains  Swelling of the abdomen that is new, acute  Fever of 100F or higher   Following upper endoscopy (EGD)  Vomiting of blood or coffee ground material  New chest pain or pain under the shoulder blades  Painful or persistently difficult swallowing  New shortness of breath  Fever of 100F or higher  Black, tarry-looking stools  For urgent or emergent issues, a gastroenterologist can be reached at any hour by calling [phone].   DIET:   We do recommend a small meal at first, but then you may proceed to your regular diet.  Drink plenty of fluids but you should avoid alcoholic beverages for 24 hours.  ACTIVITY:  You should plan to take it easy for the rest of today and you should NOT DRIVE or use heavy machinery until tomorrow (because of the sedation medicines used during the test).    FOLLOW UP: Our staff will call the number listed on your records 48-72 hours following your procedure to check on you and address any questions or concerns that you may have regarding the information given to you following your procedure. If we do not reach you, we will leave a message.  We will attempt to reach you two times.  During this call, we will ask if you have developed any symptoms of COVID 19. If you develop any symptoms (ie: fever, flu-like symptoms, shortness of breath, cough etc.) before then, please call [phone].  If you test positive for Covid 19 in the 2 weeks post procedure, please call and report this information to [place].    If any biopsies were taken you will be contacted by phone or by letter within the next 1-3 weeks.  Please call us at [phone] if you have not heard about the biopsies in 3 weeks.    SIGNATURES/CONFIDENTIALITY: You and/or your care partner have signed paperwork which will be entered into your electronic medical record.  These signatures attest to the fact that that the  information above on your After Visit Summary has been reviewed and is understood.  Full responsibility of the confidentiality of this discharge information lies with you and/or your care-partner.

## 2019-12-22 NOTE — Progress Notes (Signed)
To PACU, VSS. Report to [email or web address] 

## 2019-12-22 NOTE — Op Note (Signed)
[place] Patient Name: Calvin Wilcox Procedure Date: 12/22/2019 10:54 AM MRN: [id number] Endoscopist: Ladene Artist , MD Age: 69 Referring MD:  Date of Birth: 1951/07/11 Gender: Male Account #: [id number] Procedure:                Colonoscopy Indications:              Iron deficiency anemia Medicines:                Monitored Anesthesia Care Procedure:                Pre-Anesthesia Assessment:                           - Prior to the procedure, a History and Physical                            was performed, and patient medications and                            allergies were reviewed. The patient's tolerance of                            previous anesthesia was also reviewed. The risks                            and benefits of the procedure and the sedation                            options and risks were discussed with the patient.                            All questions were answered, and informed consent                            was obtained. Prior Anticoagulants: The patient has                            taken no previous anticoagulant or antiplatelet                            agents. ASA Grade Assessment: II - A patient with                            mild systemic disease. After reviewing the risks                            and benefits, the patient was deemed in                            satisfactory condition to undergo the procedure.                           After obtaining informed consent, the colonoscope  was passed under direct vision. Throughout the                            procedure, the patient's blood pressure, pulse, and                            oxygen saturations were monitored continuously. The                            Colonoscope was introduced through the anus and                            advanced to the the cecum, identified by                            appendiceal orifice and ileocecal valve. The                          ileocecal valve, appendiceal orifice, and rectum                            were photographed. The quality of the bowel                            preparation was good. The colonoscopy was performed                            without difficulty. The patient tolerated the                            procedure well. Scope In: 10:57:03 AM Scope Out: 11:13:54 AM Scope Withdrawal Time: 0 hours 13 minutes 48 seconds  Total Procedure Duration: 0 hours 16 minutes 51 seconds  Findings:                 The perianal and digital rectal examinations were                            normal.                           A single small localized angiodysplastic lesion                            without bleeding was found in the ascending colon.                           Two sessile polyps were found in the descending                            colon and transverse colon. The polyps were 6 to 7                            mm in size. These polyps were removed with a cold  snare. Resection and retrieval were complete.                           A few small-mouthed diverticula were found in the                            left colon. There was no evidence of diverticular                            bleeding.                           Internal hemorrhoids were found during                            retroflexion. The hemorrhoids were small and Grade                            I (internal hemorrhoids that do not prolapse).                           The exam was otherwise without abnormality on                            direct and retroflexion views. Complications:            No immediate complications. Estimated blood loss:                            None. Estimated Blood Loss:     Estimated blood loss: none. Impression:               - A single non-bleeding colonic angiodysplastic                            lesion.                           - Two 6 to 7 mm polyps in  the descending colon and                            in the transverse colon, removed with a cold snare.                            Resected and retrieved.                           - Mild diverticulosis in the left colon.                           - Internal hemorrhoids.                           - The examination was otherwise normal on direct  and retroflexion views. Recommendation:           - Repeat colonoscopy after studies are complete for                            surveillance based on pathology results.                           - Patient has a contact number available for                            emergencies. The signs and symptoms of potential                            delayed complications were discussed with the                            patient. Return to normal activities tomorrow.                            Written discharge instructions were provided to the                            patient.                           - Resume previous diet.                           - Continue present medications.                           - Await pathology results. Ladene Artist, MD 12/22/2019 11:24:06 AM This report has been signed electronically.

## 2019-12-22 NOTE — Progress Notes (Signed)
Called to room to assist during endoscopic procedure.  Patient ID and intended procedure confirmed with present staff. Received instructions for my participation in the procedure from the performing physician.  

## 2019-12-22 NOTE — Op Note (Signed)
[place] Patient Name: Calvin Wilcox Procedure Date: 12/22/2019 10:54 AM MRN: [id number] Endoscopist: Ladene Artist , MD Age: 69 Referring MD:  Date of Birth: [birth year] Gender: Male Account #: [id number] Procedure:                Upper GI endoscopy Indications:              Iron deficiency anemia, Gastroesophageal reflux                            disease Medicines:                Monitored Anesthesia Care Procedure:                Pre-Anesthesia Assessment:                           - Prior to the procedure, a History and Physical                            was performed, and patient medications and                            allergies were reviewed. The patient's tolerance of                            previous anesthesia was also reviewed. The risks                            and benefits of the procedure and the sedation                            options and risks were discussed with the patient.                            All questions were answered, and informed consent                            was obtained. Prior Anticoagulants: The patient has                            taken no previous anticoagulant or antiplatelet                            agents. ASA Grade Assessment: II - A patient with                            mild systemic disease. After reviewing the risks                            and benefits, the patient was deemed in                            satisfactory condition to undergo the procedure.  After obtaining informed consent, the endoscope was                            passed under direct vision. Throughout the                            procedure, the patient's blood pressure, pulse, and                            oxygen saturations were monitored continuously. The                            Endoscope was introduced through the mouth, and                            advanced to the second part of duodenum. The upper                             GI endoscopy was accomplished without difficulty.                            The patient tolerated the procedure well. Scope In: Scope Out: Findings:                 One benign-appearing, intrinsic moderate stenosis                            was found at the gastroesophageal junction. This                            stenosis measured 1.3 cm (inner diameter). The                            stenosis was traversed.                           The exam of the esophagus was otherwise normal.                           A small hiatal hernia was present.                           Patchy mild inflammation characterized by erythema                            and granularity was found in the entire examined                            stomach. Biopsies were taken with a cold forceps                            for histology.                           The exam of the stomach was otherwise normal.  The duodenal bulb and second portion of the                            duodenum were normal. Complications:            No immediate complications. Estimated Blood Loss:     Estimated blood loss was minimal. Impression:               - Benign-appearing esophageal stenosis.                           - Small hiatal hernia.                           - Gastritis. Biopsied.                           - Normal duodenal bulb and second portion of the                            duodenum. Recommendation:           - Patient has a contact number available for                            emergencies. The signs and symptoms of potential                            delayed complications were discussed with the                            patient. Return to normal activities tomorrow.                            Written discharge instructions were provided to the                            patient.                           - Resume previous diet.                           -  Continue present medications.                           - Await pathology results. Ladene Artist, MD 12/22/2019 11:27:09 AM This report has been signed electronically.

## 2019-12-22 NOTE — Progress Notes (Signed)
Pt's states no medical or surgical changes since previsit or office visit. 

## 2019-12-24 ENCOUNTER — Ambulatory Visit (INDEPENDENT_AMBULATORY_CARE_PROVIDER_SITE_OTHER): Payer: Medicare Other | Admitting: Internal Medicine

## 2019-12-24 ENCOUNTER — Telehealth: Payer: Self-pay

## 2019-12-24 DIAGNOSIS — E119 Type 2 diabetes mellitus without complications: Secondary | ICD-10-CM | POA: Diagnosis not present

## 2019-12-24 DIAGNOSIS — N3001 Acute cystitis with hematuria: Secondary | ICD-10-CM | POA: Diagnosis not present

## 2019-12-24 DIAGNOSIS — R350 Frequency of micturition: Secondary | ICD-10-CM | POA: Diagnosis not present

## 2019-12-24 DIAGNOSIS — I10 Essential (primary) hypertension: Secondary | ICD-10-CM | POA: Diagnosis not present

## 2019-12-24 DIAGNOSIS — R339 Retention of urine, unspecified: Secondary | ICD-10-CM | POA: Diagnosis not present

## 2019-12-24 NOTE — Telephone Encounter (Signed)
Second follow up call attempt, no answer, LM
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
## 2019-12-24 NOTE — Telephone Encounter (Signed)
1st follow up call made.  NALM 

## 2019-12-26 ENCOUNTER — Encounter: Payer: Self-pay | Admitting: Internal Medicine

## 2019-12-26 DIAGNOSIS — R339 Retention of urine, unspecified: Secondary | ICD-10-CM | POA: Insufficient documentation

## 2019-12-26 NOTE — Assessment & Plan Note (Signed)
stable overall by history and exam, recent data reviewed with pt, and pt to continue medical treatment as before,  to f/u any worsening symptoms or concerns  

## 2019-12-26 NOTE — Patient Instructions (Signed)
Please continue all other medications as before, and refills have been done if requested.  Please have the pharmacy call with any other refills you may need.  Please continue your efforts at being more active, low cholesterol diet, and weight control.  Please keep your appointments with your specialists as you may have planned     

## 2019-12-26 NOTE — Assessment & Plan Note (Signed)
Encouraged to continue to follow BP at home on regular basis

## 2019-12-26 NOTE — Assessment & Plan Note (Signed)
Etiology unlear, ? UTI - for f/u urology as planned today

## 2019-12-30 ENCOUNTER — Other Ambulatory Visit: Payer: Self-pay

## 2019-12-30 DIAGNOSIS — R338 Other retention of urine: Secondary | ICD-10-CM | POA: Diagnosis not present

## 2019-12-30 DIAGNOSIS — R339 Retention of urine, unspecified: Secondary | ICD-10-CM | POA: Diagnosis not present

## 2019-12-30 MED ORDER — PYLERA 140-125-125 MG PO CAPS: 3.0000 | ORAL_CAPSULE | Freq: Three times a day (TID) | ORAL | 0 refills | Status: DC

## 2020-01-05 ENCOUNTER — Telehealth: Payer: Self-pay | Admitting: Gastroenterology

## 2020-01-05 MED ORDER — BISMUTH SUBSALICYLATE 262 MG PO CHEW: 524.0000 mg | CHEWABLE_TABLET | Freq: Four times a day (QID) | ORAL | 0 refills | Status: AC

## 2020-01-05 MED ORDER — METRONIDAZOLE 250 MG PO TABS: 250.0000 mg | ORAL_TABLET | Freq: Four times a day (QID) | ORAL | 0 refills | Status: AC

## 2020-01-05 MED ORDER — DOXYCYCLINE HYCLATE 100 MG PO CAPS: 100.0000 mg | ORAL_CAPSULE | Freq: Two times a day (BID) | ORAL | 0 refills | Status: AC

## 2020-01-05 NOTE — Telephone Encounter (Signed)
Informed patient that we can send in a breakdown of Pylera to the pharmacy that will hopefully be a cheaper alternative. Patient verbalized understanding. Also informed patient that the omeprazole was already sent to the pharmacy and the pepto-bismol may have to be purchased over the counter. Patient understood.

## 2020-01-05 NOTE — Telephone Encounter (Signed)
Pt's wife informed that Pylera is over $1500 and that they cannot afford it.

## 2020-01-07 DIAGNOSIS — R338 Other retention of urine: Secondary | ICD-10-CM | POA: Diagnosis not present

## 2020-02-03 DIAGNOSIS — Z23 Encounter for immunization: Secondary | ICD-10-CM | POA: Diagnosis not present

## 2020-02-24 DIAGNOSIS — N289 Disorder of kidney and ureter, unspecified: Secondary | ICD-10-CM | POA: Diagnosis not present

## 2020-03-07 DIAGNOSIS — Z23 Encounter for immunization: Secondary | ICD-10-CM | POA: Diagnosis not present

## 2020-03-24 DIAGNOSIS — N183 Chronic kidney disease, stage 3 unspecified: Secondary | ICD-10-CM | POA: Diagnosis not present

## 2020-03-24 DIAGNOSIS — N289 Disorder of kidney and ureter, unspecified: Secondary | ICD-10-CM | POA: Diagnosis not present

## 2020-03-24 DIAGNOSIS — I129 Hypertensive chronic kidney disease with stage 1 through stage 4 chronic kidney disease, or unspecified chronic kidney disease: Secondary | ICD-10-CM | POA: Diagnosis not present

## 2020-03-24 DIAGNOSIS — E1122 Type 2 diabetes mellitus with diabetic chronic kidney disease: Secondary | ICD-10-CM | POA: Diagnosis not present

## 2020-05-17 ENCOUNTER — Other Ambulatory Visit: Payer: Self-pay | Admitting: Internal Medicine

## 2020-05-19 ENCOUNTER — Other Ambulatory Visit: Payer: Self-pay

## 2020-05-19 ENCOUNTER — Encounter: Payer: Self-pay | Admitting: Internal Medicine

## 2020-05-19 ENCOUNTER — Ambulatory Visit (INDEPENDENT_AMBULATORY_CARE_PROVIDER_SITE_OTHER): Payer: Medicare Other | Admitting: Internal Medicine

## 2020-05-19 VITALS — BP 110/78 | HR 59 | Temp 97.8°F | Ht 69.0 in | Wt 200.0 lb

## 2020-05-19 DIAGNOSIS — E611 Iron deficiency: Secondary | ICD-10-CM | POA: Diagnosis not present

## 2020-05-19 DIAGNOSIS — Z0000 Encounter for general adult medical examination without abnormal findings: Secondary | ICD-10-CM | POA: Diagnosis not present

## 2020-05-19 DIAGNOSIS — E119 Type 2 diabetes mellitus without complications: Secondary | ICD-10-CM | POA: Diagnosis not present

## 2020-05-19 DIAGNOSIS — E559 Vitamin D deficiency, unspecified: Secondary | ICD-10-CM

## 2020-05-19 DIAGNOSIS — E538 Deficiency of other specified B group vitamins: Secondary | ICD-10-CM
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
## 2020-05-19 NOTE — Patient Instructions (Signed)

## 2020-05-20 LAB — FERRITIN: Ferritin: 66.2 ng/mL (ref 22.0–322.0)

## 2020-05-20 LAB — VITAMIN B12: Vitamin B-12: 260 pg/mL (ref 211–911)

## 2020-05-20 LAB — LIPID PANEL
Cholesterol: 167 mg/dL (ref 0–200)
HDL: 36.8 mg/dL — ABNORMAL LOW (ref >39.00)
NonHDL: 130.01
Total CHOL/HDL Ratio: 5
Triglycerides: 241 mg/dL — ABNORMAL HIGH (ref 0.0–149.0)
VLDL: 48.2 mg/dL — ABNORMAL HIGH (ref 0.0–40.0)

## 2020-05-20 LAB — BASIC METABOLIC PANEL
BUN: 22 mg/dL (ref 6–23)
CO2: 32 mEq/L (ref 19–32)
Calcium: 9.3 mg/dL (ref 8.4–10.5)
Chloride: 99 mEq/L (ref 96–112)
Creatinine, Ser: 1.73 mg/dL — ABNORMAL HIGH (ref 0.40–1.50)
GFR: 47.4 mL/min — ABNORMAL LOW (ref >60.00)
Glucose, Bld: 128 mg/dL — ABNORMAL HIGH (ref 70–99)
Potassium: 4 mEq/L (ref 3.5–5.1)
Sodium: 135 mEq/L (ref 135–145)

## 2020-05-20 LAB — URINALYSIS, ROUTINE W REFLEX MICROSCOPIC
Bilirubin Urine: NEGATIVE
Hgb urine dipstick: NEGATIVE
Ketones, ur: NEGATIVE
Leukocytes,Ua: NEGATIVE
Nitrite: NEGATIVE
RBC / HPF: NONE SEEN (ref 0–?)
Specific Gravity, Urine: 1.02 (ref 1.000–1.030)
Total Protein, Urine: NEGATIVE
Urine Glucose: >=1000 — AB
Urobilinogen, UA: 0.2 (ref 0.0–1.0)
pH: 6 (ref 5.0–8.0)

## 2020-05-20 LAB — HEPATIC FUNCTION PANEL
ALT: 25 U/L (ref 0–53)
AST: 27 U/L (ref 0–37)
Albumin: 4.3 g/dL (ref 3.5–5.2)
Alkaline Phosphatase: 110 U/L (ref 39–117)
Bilirubin, Direct: 0.1 mg/dL (ref 0.0–0.3)
Total Bilirubin: 0.5 mg/dL (ref 0.2–1.2)
Total Protein: 7.5 g/dL (ref 6.0–8.3)

## 2020-05-20 LAB — CBC WITH DIFFERENTIAL/PLATELET
Basophils Absolute: 0.1 10*3/uL (ref 0.0–0.1)
Basophils Relative: 0.8 % (ref 0.0–3.0)
Eosinophils Absolute: 0.1 10*3/uL (ref 0.0–0.7)
Eosinophils Relative: 1.7 % (ref 0.0–5.0)
HCT: 46.1 % (ref 39.0–52.0)
Hemoglobin: 15.1 g/dL (ref 13.0–17.0)
Lymphocytes Relative: 38 % (ref 12.0–46.0)
Lymphs Abs: 2.6 10*3/uL (ref 0.7–4.0)
MCHC: 32.7 g/dL (ref 30.0–36.0)
MCV: 82.7 fl (ref 78.0–100.0)
Monocytes Absolute: 0.9 10*3/uL (ref 0.1–1.0)
Monocytes Relative: 12.6 % — ABNORMAL HIGH (ref 3.0–12.0)
Neutro Abs: 3.2 10*3/uL (ref 1.4–7.7)
Neutrophils Relative %: 46.9 % (ref 43.0–77.0)
Platelets: 330 10*3/uL (ref 150.0–400.0)
RBC: 5.57 Mil/uL (ref 4.22–5.81)
RDW: 18.7 % — ABNORMAL HIGH (ref 11.5–15.5)
WBC: 6.9 10*3/uL (ref 4.0–10.5)

## 2020-05-20 LAB — MICROALBUMIN / CREATININE URINE RATIO
Creatinine,U: 74.9 mg/dL
Microalb Creat Ratio: 0.9 mg/g (ref 0.0–30.0)
Microalb, Ur: <0.7 mg/dL (ref 0.0–1.9)

## 2020-05-20 LAB — VITAMIN D 25 HYDROXY (VIT D DEFICIENCY, FRACTURES): VITD: 39.96 ng/mL (ref 30.00–100.00)

## 2020-05-20 LAB — IBC PANEL
Iron: 34 ug/dL — ABNORMAL LOW (ref 42–165)
Saturation Ratios: 10.2 % — ABNORMAL LOW (ref 20.0–50.0)
Transferrin: 237 mg/dL (ref 212.0–360.0)

## 2020-05-20 LAB — LDL CHOLESTEROL, DIRECT: Direct LDL: 96 mg/dL

## 2020-05-20 LAB — TSH: TSH: 1.17 u[IU]/mL (ref 0.35–4.50)

## 2020-05-20 LAB — PSA: PSA: 4.07 ng/mL — ABNORMAL HIGH (ref 0.10–4.00)

## 2020-05-20 LAB — HEMOGLOBIN A1C: Hgb A1c MFr Bld: 7 % — ABNORMAL HIGH (ref 4.6–6.5)

## 2020-05-22 ENCOUNTER — Encounter: Payer: Self-pay | Admitting: Internal Medicine

## 2020-05-22 NOTE — Assessment & Plan Note (Signed)
stable overall by history and exam, recent data reviewed with pt, and pt to continue medical treatment as before,  to f/u any worsening symptoms or concerns  

## 2020-05-22 NOTE — Assessment & Plan Note (Signed)

## 2020-05-22 NOTE — Assessment & Plan Note (Signed)
Cont oral replacement 

## 2020-05-22 NOTE — Assessment & Plan Note (Signed)
For lab f/u 

## 2020-05-24 DIAGNOSIS — I129 Hypertensive chronic kidney disease with stage 1 through stage 4 chronic kidney disease, or unspecified chronic kidney disease: Secondary | ICD-10-CM | POA: Diagnosis not present

## 2020-05-24 DIAGNOSIS — N183 Chronic kidney disease, stage 3 unspecified: Secondary | ICD-10-CM | POA: Diagnosis not present

## 2020-05-24 DIAGNOSIS — E1122 Type 2 diabetes mellitus with diabetic chronic kidney disease: Secondary | ICD-10-CM | POA: Diagnosis not present

## 2020-05-31 DIAGNOSIS — E611 Iron deficiency: Secondary | ICD-10-CM | POA: Diagnosis not present

## 2020-05-31 DIAGNOSIS — I129 Hypertensive chronic kidney disease with stage 1 through stage 4 chronic kidney disease, or unspecified chronic kidney disease: Secondary | ICD-10-CM | POA: Diagnosis not present

## 2020-05-31 DIAGNOSIS — E1122 Type 2 diabetes mellitus with diabetic chronic kidney disease: Secondary | ICD-10-CM | POA: Diagnosis not present

## 2020-06-29 DIAGNOSIS — I129 Hypertensive chronic kidney disease with stage 1 through stage 4 chronic kidney disease, or unspecified chronic kidney disease: Secondary | ICD-10-CM | POA: Diagnosis not present

## 2020-06-29 DIAGNOSIS — N2889 Other specified disorders of kidney and ureter: Secondary | ICD-10-CM | POA: Diagnosis not present

## 2020-06-29 DIAGNOSIS — N189 Chronic kidney disease, unspecified: Secondary | ICD-10-CM | POA: Diagnosis not present

## 2020-06-29 DIAGNOSIS — N183 Chronic kidney disease, stage 3 unspecified: Secondary | ICD-10-CM | POA: Diagnosis not present

## 2020-06-29 DIAGNOSIS — E1122 Type 2 diabetes mellitus with diabetic chronic kidney disease: Secondary | ICD-10-CM | POA: Diagnosis not present

## 2020-07-05 ENCOUNTER — Other Ambulatory Visit: Payer: Self-pay | Admitting: Internal Medicine

## 2020-07-05 NOTE — Telephone Encounter (Signed)
Please refill as per office routine med refill policy (all routine meds refilled for 3 mo or monthly per pt preference up to one year from last visit, then month to month grace period for 3 mo, then further med refills will have to be denied)  

## 2020-07-21 DIAGNOSIS — N2889 Other specified disorders of kidney and ureter: Secondary | ICD-10-CM | POA: Diagnosis not present

## 2020-07-21 DIAGNOSIS — N189 Chronic kidney disease, unspecified: Secondary | ICD-10-CM | POA: Diagnosis not present

## 2020-07-21 DIAGNOSIS — N281 Cyst of kidney, acquired: Secondary | ICD-10-CM | POA: Diagnosis not present

## 2020-08-10 DIAGNOSIS — C641 Malignant neoplasm of right kidney, except renal pelvis: Secondary | ICD-10-CM | POA: Insufficient documentation

## 2020-08-10 DIAGNOSIS — N2889 Other specified disorders of kidney and ureter: Secondary | ICD-10-CM | POA: Diagnosis not present

## 2020-08-16 ENCOUNTER — Telehealth: Payer: Self-pay | Admitting: Internal Medicine

## 2020-08-16 NOTE — Telephone Encounter (Signed)
[facility] [phone] - [place], [place] - [address] AT [place] Phone:  [phone]  Fax:  [phone]     empagliflozin (JARDIANCE) 10 MG TABS tablet  Patient states medication is over $100  With insurance... Needs a medication that is less expensive

## 2020-08-17 MED ORDER — GLIPIZIDE ER 5 MG PO TB24: 5.0000 mg | ORAL_TABLET | Freq: Every day | ORAL | 3 refills | Status: DC

## 2020-08-17 NOTE — Telephone Encounter (Signed)
Ok to stop the [employer] to increase the glipizide ER to 5 mg per day - done erx

## 2020-08-18 NOTE — Telephone Encounter (Signed)
Left message for patient regarding medication change.

## 2020-08-30 DIAGNOSIS — N2889 Other specified disorders of kidney and ureter: Secondary | ICD-10-CM | POA: Diagnosis not present

## 2020-09-27 DIAGNOSIS — E611 Iron deficiency: Secondary | ICD-10-CM | POA: Diagnosis not present

## 2020-09-27 DIAGNOSIS — N183 Chronic kidney disease, stage 3 unspecified: Secondary | ICD-10-CM | POA: Diagnosis not present

## 2020-09-27 DIAGNOSIS — I129 Hypertensive chronic kidney disease with stage 1 through stage 4 chronic kidney disease, or unspecified chronic kidney disease: Secondary | ICD-10-CM | POA: Diagnosis not present

## 2020-10-04 DIAGNOSIS — I129 Hypertensive chronic kidney disease with stage 1 through stage 4 chronic kidney disease, or unspecified chronic kidney disease: Secondary | ICD-10-CM | POA: Diagnosis not present

## 2020-10-04 DIAGNOSIS — E1122 Type 2 diabetes mellitus with diabetic chronic kidney disease: Secondary | ICD-10-CM | POA: Diagnosis not present

## 2020-10-04 DIAGNOSIS — R7989 Other specified abnormal findings of blood chemistry: Secondary | ICD-10-CM | POA: Diagnosis not present

## 2020-10-04 DIAGNOSIS — E538 Deficiency of other specified B group vitamins: Secondary | ICD-10-CM | POA: Diagnosis not present

## 2020-10-05 DIAGNOSIS — N289 Disorder of kidney and ureter, unspecified: Secondary | ICD-10-CM | POA: Diagnosis not present

## 2020-10-05 DIAGNOSIS — I10 Essential (primary) hypertension: Secondary | ICD-10-CM | POA: Diagnosis not present

## 2020-10-05 DIAGNOSIS — C641 Malignant neoplasm of right kidney, except renal pelvis: Secondary | ICD-10-CM | POA: Diagnosis not present

## 2020-10-05 DIAGNOSIS — N2889 Other specified disorders of kidney and ureter: Secondary | ICD-10-CM | POA: Diagnosis not present

## 2020-10-05 DIAGNOSIS — Z7984 Long term (current) use of oral hypoglycemic drugs: Secondary | ICD-10-CM | POA: Diagnosis not present

## 2020-10-05 DIAGNOSIS — E119 Type 2 diabetes mellitus without complications: Secondary | ICD-10-CM | POA: Diagnosis not present

## 2020-10-06 DIAGNOSIS — Z7984 Long term (current) use of oral hypoglycemic drugs: Secondary | ICD-10-CM | POA: Diagnosis not present

## 2020-10-06 DIAGNOSIS — N289 Disorder of kidney and ureter, unspecified: Secondary | ICD-10-CM | POA: Diagnosis not present

## 2020-10-06 DIAGNOSIS — N2889 Other specified disorders of kidney and ureter: Secondary | ICD-10-CM | POA: Diagnosis not present

## 2020-10-06 DIAGNOSIS — E119 Type 2 diabetes mellitus without complications: Secondary | ICD-10-CM | POA: Diagnosis not present

## 2020-10-06 DIAGNOSIS — K5900 Constipation, unspecified: Secondary | ICD-10-CM | POA: Diagnosis not present

## 2020-10-11 DIAGNOSIS — N2889 Other specified disorders of kidney and ureter: Secondary | ICD-10-CM | POA: Diagnosis not present

## 2020-10-14 DIAGNOSIS — N2889 Other specified disorders of kidney and ureter: Secondary | ICD-10-CM | POA: Diagnosis not present

## 2020-10-17 ENCOUNTER — Telehealth: Payer: Self-pay | Admitting: Internal Medicine

## 2020-10-17 NOTE — Telephone Encounter (Signed)
LVM for pt to rtn  My call to schedule AWV-I with [facility]. Please schedule this appt if patient calls the office.   Thanks  [phone]

## 2020-10-18 DIAGNOSIS — N2889 Other specified disorders of kidney and ureter: Secondary | ICD-10-CM | POA: Diagnosis not present

## 2020-10-19 DIAGNOSIS — I129 Hypertensive chronic kidney disease with stage 1 through stage 4 chronic kidney disease, or unspecified chronic kidney disease: Secondary | ICD-10-CM | POA: Diagnosis not present

## 2020-10-19 DIAGNOSIS — N183 Chronic kidney disease, stage 3 unspecified: Secondary | ICD-10-CM | POA: Diagnosis not present

## 2020-10-21 DIAGNOSIS — R339 Retention of urine, unspecified: Secondary | ICD-10-CM | POA: Diagnosis not present

## 2020-10-21 DIAGNOSIS — N2889 Other specified disorders of kidney and ureter: Secondary | ICD-10-CM | POA: Diagnosis not present

## 2020-10-24 ENCOUNTER — Other Ambulatory Visit: Payer: Self-pay

## 2020-10-24 ENCOUNTER — Ambulatory Visit (INDEPENDENT_AMBULATORY_CARE_PROVIDER_SITE_OTHER): Payer: Medicare Other | Admitting: Internal Medicine

## 2020-10-24 ENCOUNTER — Encounter: Payer: Self-pay | Admitting: Internal Medicine

## 2020-10-24 DIAGNOSIS — I10 Essential (primary) hypertension: Secondary | ICD-10-CM

## 2020-10-24 DIAGNOSIS — E1165 Type 2 diabetes mellitus with hyperglycemia: Secondary | ICD-10-CM

## 2020-10-24 DIAGNOSIS — N1831 Chronic kidney disease, stage 3a: Secondary | ICD-10-CM | POA: Diagnosis not present

## 2020-10-24 DIAGNOSIS — R972 Elevated prostate specific antigen [PSA]: Secondary | ICD-10-CM

## 2020-10-24 DIAGNOSIS — E611 Iron deficiency: Secondary | ICD-10-CM

## 2020-10-24 DIAGNOSIS — N401 Enlarged prostate with lower urinary tract symptoms: Secondary | ICD-10-CM | POA: Diagnosis not present

## 2020-10-24 DIAGNOSIS — E538 Deficiency of other specified B group vitamins: Secondary | ICD-10-CM

## 2020-10-24 DIAGNOSIS — R3914 Feeling of incomplete bladder emptying: Secondary | ICD-10-CM

## 2020-10-24 NOTE — Patient Instructions (Addendum)
Please continue all other medications as before, and refills have been done if requested.  Please have the pharmacy call with any other refills you may need.  Please continue your efforts at being more active, low cholesterol diet, and weight control  Please keep your appointments with your specialists as you may have planned  Please make an Appointment to return in 1 months, or sooner if needed
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
## 2020-10-30 ENCOUNTER — Encounter: Payer: Self-pay | Admitting: Internal Medicine

## 2020-10-30 NOTE — Assessment & Plan Note (Signed)
stable overall by history and exam, recent data reviewed with pt, and pt to continue medical treatment as before,  to f/u any worsening symptoms or concerns  

## 2020-10-30 NOTE — Assessment & Plan Note (Signed)
To continue oral replacement

## 2020-10-30 NOTE — Assessment & Plan Note (Signed)
Also for f/U lab soon

## 2020-10-30 NOTE — Assessment & Plan Note (Signed)
For f/u lab in 1 mo

## 2020-10-30 NOTE — Assessment & Plan Note (Signed)
Recently more symptomatic, has foley in place, no fever, for TURP soon, to f/u urology

## 2020-11-05 ENCOUNTER — Other Ambulatory Visit: Payer: Self-pay | Admitting: Internal Medicine

## 2020-11-05 NOTE — Telephone Encounter (Signed)
Please refill as per office routine med refill policy (all routine meds refilled for 3 mo or monthly per pt preference up to one year from last visit, then month to month grace period for 3 mo, then further med refills will have to be denied)  

## 2020-11-08 ENCOUNTER — Telehealth: Payer: Self-pay | Admitting: Internal Medicine

## 2020-11-08 NOTE — Telephone Encounter (Signed)
    Please call to clarify dosage of glipiZIDE (GLIPIZIDE XL)  Spouse unsure if patient should be taking 2.5 or 5mg

## 2020-11-09 NOTE — Telephone Encounter (Signed)
Patient notified that the glipizide should be 5mg 

## 2020-11-16 ENCOUNTER — Other Ambulatory Visit: Payer: Self-pay | Admitting: Internal Medicine

## 2020-11-16 ENCOUNTER — Other Ambulatory Visit (INDEPENDENT_AMBULATORY_CARE_PROVIDER_SITE_OTHER): Payer: Medicare Other

## 2020-11-16 DIAGNOSIS — E1165 Type 2 diabetes mellitus with hyperglycemia: Secondary | ICD-10-CM

## 2020-11-16 DIAGNOSIS — R972 Elevated prostate specific antigen [PSA]: Secondary | ICD-10-CM | POA: Diagnosis not present

## 2020-11-16 DIAGNOSIS — N1831 Chronic kidney disease, stage 3a: Secondary | ICD-10-CM | POA: Diagnosis not present

## 2020-11-16 DIAGNOSIS — E611 Iron deficiency: Secondary | ICD-10-CM

## 2020-11-16 DIAGNOSIS — E538 Deficiency of other specified B group vitamins: Secondary | ICD-10-CM

## 2020-11-16 LAB — CBC WITH DIFFERENTIAL/PLATELET
Basophils Absolute: 0.1 10*3/uL (ref 0.0–0.1)
Basophils Relative: 1.6 % (ref 0.0–3.0)
Eosinophils Absolute: 0.1 10*3/uL (ref 0.0–0.7)
Eosinophils Relative: 1.1 % (ref 0.0–5.0)
HCT: 39.8 % (ref 39.0–52.0)
Hemoglobin: 13.2 g/dL (ref 13.0–17.0)
Lymphocytes Relative: 33.3 % (ref 12.0–46.0)
Lymphs Abs: 2.6 10*3/uL (ref 0.7–4.0)
MCHC: 33.2 g/dL (ref 30.0–36.0)
MCV: 84 fl (ref 78.0–100.0)
Monocytes Absolute: 0.8 10*3/uL (ref 0.1–1.0)
Monocytes Relative: 10.3 % (ref 3.0–12.0)
Neutro Abs: 4.2 10*3/uL (ref 1.4–7.7)
Neutrophils Relative %: 53.7 % (ref 43.0–77.0)
Platelets: 516 10*3/uL — ABNORMAL HIGH (ref 150.0–400.0)
RBC: 4.74 Mil/uL (ref 4.22–5.81)
RDW: 17.4 % — ABNORMAL HIGH (ref 11.5–15.5)
WBC: 7.8 10*3/uL (ref 4.0–10.5)

## 2020-11-16 LAB — IBC PANEL
Iron: 34 ug/dL — ABNORMAL LOW (ref 42–165)
Saturation Ratios: 14.4 % — ABNORMAL LOW (ref 20.0–50.0)
Transferrin: 169 mg/dL — ABNORMAL LOW (ref 212.0–360.0)

## 2020-11-16 LAB — HEPATIC FUNCTION PANEL
ALT: 28 U/L (ref 0–53)
AST: 21 U/L (ref 0–37)
Albumin: 3.8 g/dL (ref 3.5–5.2)
Alkaline Phosphatase: 80 U/L (ref 39–117)
Bilirubin, Direct: 0.1 mg/dL (ref 0.0–0.3)
Total Bilirubin: 0.6 mg/dL (ref 0.2–1.2)
Total Protein: 7.8 g/dL (ref 6.0–8.3)

## 2020-11-16 LAB — URINALYSIS, ROUTINE W REFLEX MICROSCOPIC
Bilirubin Urine: NEGATIVE
Ketones, ur: NEGATIVE
Nitrite: NEGATIVE
Specific Gravity, Urine: 1.02 (ref 1.000–1.030)
Total Protein, Urine: NEGATIVE
Urine Glucose: NEGATIVE
Urobilinogen, UA: 0.2 (ref 0.0–1.0)
pH: 6 (ref 5.0–8.0)

## 2020-11-16 LAB — LIPID PANEL
Cholesterol: 171 mg/dL (ref 0–200)
HDL: 30.8 mg/dL — ABNORMAL LOW (ref >39.00)
LDL Cholesterol: 105 mg/dL — ABNORMAL HIGH (ref 0–99)
NonHDL: 140.41
Total CHOL/HDL Ratio: 6
Triglycerides: 178 mg/dL — ABNORMAL HIGH (ref 0.0–149.0)
VLDL: 35.6 mg/dL (ref 0.0–40.0)

## 2020-11-16 LAB — TSH: TSH: 0.97 u[IU]/mL (ref 0.35–4.50)

## 2020-11-16 LAB — MICROALBUMIN / CREATININE URINE RATIO
Creatinine,U: 104.1 mg/dL
Microalb Creat Ratio: 2.1 mg/g (ref 0.0–30.0)
Microalb, Ur: 2.2 mg/dL — ABNORMAL HIGH (ref 0.0–1.9)

## 2020-11-16 LAB — HEMOGLOBIN A1C: Hgb A1c MFr Bld: 6.8 % — ABNORMAL HIGH (ref 4.6–6.5)

## 2020-11-16 LAB — VITAMIN D 25 HYDROXY (VIT D DEFICIENCY, FRACTURES): VITD: 39.85 ng/mL (ref 30.00–100.00)

## 2020-11-16 LAB — FERRITIN: Ferritin: 610.7 ng/mL — ABNORMAL HIGH (ref 22.0–322.0)

## 2020-11-16 LAB — VITAMIN B12: Vitamin B-12: 398 pg/mL (ref 211–911)

## 2020-11-16 LAB — PSA: PSA: 5.29 ng/mL — ABNORMAL HIGH (ref 0.10–4.00)

## 2020-11-16 LAB — PHOSPHORUS: Phosphorus: 2.9 mg/dL (ref 2.3–4.6)

## 2020-11-16 MED ORDER — CIPROFLOXACIN HCL 500 MG PO TABS: 500.0000 mg | ORAL_TABLET | Freq: Two times a day (BID) | ORAL | 0 refills | Status: AC

## 2020-11-16 NOTE — Addendum Note (Signed)
Addended by: Trenda Moots on: 14/06/928 02:51 PM   Modules accepted: Orders

## 2020-11-18 LAB — PTH, INTACT AND CALCIUM
Calcium: 8.9 mg/dL (ref 8.6–10.3)
PTH: 9 pg/mL — ABNORMAL LOW (ref 14–64)

## 2020-11-21 DIAGNOSIS — R338 Other retention of urine: Secondary | ICD-10-CM | POA: Diagnosis not present

## 2020-11-21 DIAGNOSIS — R339 Retention of urine, unspecified: Secondary | ICD-10-CM | POA: Diagnosis not present

## 2020-11-21 DIAGNOSIS — Z01818 Encounter for other preprocedural examination: Secondary | ICD-10-CM | POA: Diagnosis not present

## 2020-11-21 DIAGNOSIS — R001 Bradycardia, unspecified: Secondary | ICD-10-CM | POA: Diagnosis not present

## 2020-11-21 DIAGNOSIS — Z01810 Encounter for preprocedural cardiovascular examination: Secondary | ICD-10-CM | POA: Diagnosis not present

## 2020-11-21 DIAGNOSIS — N138 Other obstructive and reflux uropathy: Secondary | ICD-10-CM | POA: Diagnosis not present

## 2020-11-21 DIAGNOSIS — M25569 Pain in unspecified knee: Secondary | ICD-10-CM | POA: Insufficient documentation

## 2020-11-25 ENCOUNTER — Ambulatory Visit (INDEPENDENT_AMBULATORY_CARE_PROVIDER_SITE_OTHER): Payer: Medicare Other | Admitting: Internal Medicine

## 2020-11-25 ENCOUNTER — Other Ambulatory Visit: Payer: Self-pay

## 2020-11-25 ENCOUNTER — Encounter: Payer: Self-pay | Admitting: Internal Medicine

## 2020-11-25 VITALS — BP 138/82 | HR 71 | Temp 98.4°F | Ht 69.0 in | Wt 196.0 lb

## 2020-11-25 DIAGNOSIS — N3000 Acute cystitis without hematuria: Secondary | ICD-10-CM

## 2020-11-25 DIAGNOSIS — R3914 Feeling of incomplete bladder emptying: Secondary | ICD-10-CM

## 2020-11-25 DIAGNOSIS — E7849 Other hyperlipidemia: Secondary | ICD-10-CM

## 2020-11-25 DIAGNOSIS — E538 Deficiency of other specified B group vitamins: Secondary | ICD-10-CM

## 2020-11-25 DIAGNOSIS — Z0000 Encounter for general adult medical examination without abnormal findings: Secondary | ICD-10-CM

## 2020-11-25 DIAGNOSIS — N1831 Chronic kidney disease, stage 3a: Secondary | ICD-10-CM

## 2020-11-25 DIAGNOSIS — E1165 Type 2 diabetes mellitus with hyperglycemia: Secondary | ICD-10-CM

## 2020-11-25 DIAGNOSIS — N401 Enlarged prostate with lower urinary tract symptoms: Secondary | ICD-10-CM

## 2020-11-25 DIAGNOSIS — Z0001 Encounter for general adult medical examination with abnormal findings: Secondary | ICD-10-CM

## 2020-11-25 DIAGNOSIS — I10 Essential (primary) hypertension: Secondary | ICD-10-CM | POA: Diagnosis not present

## 2020-11-25 DIAGNOSIS — E559 Vitamin D deficiency, unspecified: Secondary | ICD-10-CM
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
## 2020-11-27 ENCOUNTER — Encounter: Payer: Self-pay | Admitting: Internal Medicine

## 2020-11-27 DIAGNOSIS — N390 Urinary tract infection, site not specified: Secondary | ICD-10-CM | POA: Insufficient documentation

## 2020-11-27 NOTE — Assessment & Plan Note (Signed)
stable overall by history and exam, recent data reviewed with pt, and pt to continue medical treatment as before,  to f/u any worsening symptoms or concerns  

## 2020-11-27 NOTE — Assessment & Plan Note (Signed)

## 2020-11-27 NOTE — Assessment & Plan Note (Addendum)
Mild, incidental on pre visit labs, tolerating antibx and to finish,  to f/u any worsening symptoms or concerns  I spent 41 minutes in addition to time for CPX wellness examination in preparing to see the patient by review of recent labs, imaging and procedures, obtaining and reviewing separately obtained history, communicating with the patient and family or caregiver, ordering medications, tests or procedures, and documenting clinical information in the EHR including the differential Dx, treatment, and any further evaluation and other management of uti, b12 def, ckd, dm, htn, hld, bph

## 2020-11-27 NOTE — Assessment & Plan Note (Signed)
For contd oral replacement

## 2020-11-27 NOTE — Assessment & Plan Note (Signed)
Stable, to continue f/u for TURP soon

## 2020-11-27 NOTE — Addendum Note (Signed)
Addended by: Biagio Borg on: 11/27/2020 03:45 PM   Modules accepted: Orders

## 2020-11-27 NOTE — Patient Instructions (Signed)
Please continue all other medications as before, and refills have been done if requested.  Please have the pharmacy call with any other refills you may need.  Please continue your efforts at being more active, low cholesterol diet, and weight control.  You are otherwise up to date with prevention measures today.  Please keep your appointments with your specialists as you may have planned  Please make an Appointment to return in 6 months, or sooner if needed, also with Lab Appointment for testing done 3-5 days before at the FIRST FLOOR Lab (so this is for TWO appointments - please see the scheduling desk as you leave)  Due to the ongoing Covid 19 pandemic, our lab now requires an appointment for any labs done at our office.  If you need labs done and do not have an appointment, please call our office ahead of time to schedule before presenting to the lab for your testing.  

## 2020-11-27 NOTE — Assessment & Plan Note (Signed)
BP Readings from Last 3 Encounters:  11/25/20 138/82  10/24/20 130/76  05/19/20 110/78  stable overall by history and exam, recent data reviewed with pt, and pt to continue medical treatment as before,  to f/u any worsening symptoms or concerns

## 2020-11-27 NOTE — Assessment & Plan Note (Signed)
Lab Results  Component Value Date   LDLCALC 105 (H) 11/16/2020   For lower chol diet, declines other med change for now

## 2020-11-28 DIAGNOSIS — R338 Other retention of urine: Secondary | ICD-10-CM | POA: Diagnosis not present

## 2020-11-28 DIAGNOSIS — Z87891 Personal history of nicotine dependence: Secondary | ICD-10-CM | POA: Diagnosis not present

## 2020-11-28 DIAGNOSIS — I129 Hypertensive chronic kidney disease with stage 1 through stage 4 chronic kidney disease, or unspecified chronic kidney disease: Secondary | ICD-10-CM | POA: Diagnosis not present

## 2020-11-28 DIAGNOSIS — Z7982 Long term (current) use of aspirin: Secondary | ICD-10-CM | POA: Diagnosis not present

## 2020-11-28 DIAGNOSIS — N138 Other obstructive and reflux uropathy: Secondary | ICD-10-CM | POA: Diagnosis not present

## 2020-11-28 DIAGNOSIS — Z79899 Other long term (current) drug therapy: Secondary | ICD-10-CM | POA: Diagnosis not present

## 2020-11-28 DIAGNOSIS — Z7984 Long term (current) use of oral hypoglycemic drugs: Secondary | ICD-10-CM | POA: Diagnosis not present

## 2020-11-28 DIAGNOSIS — N183 Chronic kidney disease, stage 3 unspecified: Secondary | ICD-10-CM | POA: Diagnosis not present

## 2020-11-28 DIAGNOSIS — E1122 Type 2 diabetes mellitus with diabetic chronic kidney disease: Secondary | ICD-10-CM | POA: Diagnosis not present

## 2020-11-28 DIAGNOSIS — K219 Gastro-esophageal reflux disease without esophagitis: Secondary | ICD-10-CM | POA: Diagnosis not present

## 2020-11-29 DIAGNOSIS — R338 Other retention of urine: Secondary | ICD-10-CM | POA: Diagnosis not present

## 2020-11-29 DIAGNOSIS — N138 Other obstructive and reflux uropathy: Secondary | ICD-10-CM | POA: Diagnosis not present

## 2020-11-29 DIAGNOSIS — Z7984 Long term (current) use of oral hypoglycemic drugs: Secondary | ICD-10-CM | POA: Diagnosis not present

## 2020-11-29 DIAGNOSIS — K219 Gastro-esophageal reflux disease without esophagitis: Secondary | ICD-10-CM | POA: Diagnosis not present

## 2020-11-29 DIAGNOSIS — N183 Chronic kidney disease, stage 3 unspecified: Secondary | ICD-10-CM | POA: Diagnosis not present

## 2020-11-29 DIAGNOSIS — E1122 Type 2 diabetes mellitus with diabetic chronic kidney disease: Secondary | ICD-10-CM | POA: Diagnosis not present

## 2020-11-29 DIAGNOSIS — Z7982 Long term (current) use of aspirin: Secondary | ICD-10-CM | POA: Diagnosis not present

## 2020-11-29 DIAGNOSIS — Z79899 Other long term (current) drug therapy: Secondary | ICD-10-CM | POA: Diagnosis not present

## 2020-11-29 DIAGNOSIS — Z87891 Personal history of nicotine dependence: Secondary | ICD-10-CM | POA: Diagnosis not present

## 2020-11-29 DIAGNOSIS — I129 Hypertensive chronic kidney disease with stage 1 through stage 4 chronic kidney disease, or unspecified chronic kidney disease: Secondary | ICD-10-CM | POA: Diagnosis not present

## 2020-12-01 DIAGNOSIS — N138 Other obstructive and reflux uropathy: Secondary | ICD-10-CM | POA: Diagnosis not present

## 2020-12-06 ENCOUNTER — Ambulatory Visit (INDEPENDENT_AMBULATORY_CARE_PROVIDER_SITE_OTHER): Payer: Medicare Other | Admitting: Internal Medicine

## 2020-12-06 ENCOUNTER — Other Ambulatory Visit: Payer: Self-pay

## 2020-12-06 ENCOUNTER — Encounter: Payer: Self-pay | Admitting: Internal Medicine

## 2020-12-06 DIAGNOSIS — I10 Essential (primary) hypertension: Secondary | ICD-10-CM

## 2020-12-06 DIAGNOSIS — R300 Dysuria: Secondary | ICD-10-CM | POA: Diagnosis not present

## 2020-12-06 DIAGNOSIS — N1831 Chronic kidney disease, stage 3a: Secondary | ICD-10-CM | POA: Diagnosis not present

## 2020-12-06 DIAGNOSIS — N3000 Acute cystitis without hematuria: Secondary | ICD-10-CM | POA: Diagnosis not present

## 2020-12-06 DIAGNOSIS — R972 Elevated prostate specific antigen [PSA]: Secondary | ICD-10-CM

## 2020-12-06 LAB — URINALYSIS, ROUTINE W REFLEX MICROSCOPIC
Bilirubin Urine: NEGATIVE
Ketones, ur: NEGATIVE
Nitrite: NEGATIVE
Specific Gravity, Urine: 1.02 (ref 1.000–1.030)
Total Protein, Urine: 100 — AB
Urine Glucose: NEGATIVE
Urobilinogen, UA: 0.2 (ref 0.0–1.0)
pH: 7.5 (ref 5.0–8.0)

## 2020-12-06 MED ORDER — CIPROFLOXACIN HCL 500 MG PO TABS: 500.0000 mg | ORAL_TABLET | Freq: Two times a day (BID) | ORAL | 0 refills | Status: DC

## 2020-12-06 NOTE — Assessment & Plan Note (Signed)
Labs were reviewed Good hydration Treat HTN, monitor labs

## 2020-12-06 NOTE — Assessment & Plan Note (Addendum)
BP Readings from Last 3 Encounters:  12/06/20 120/80  11/25/20 138/82  10/24/20 130/76   On Maxzide

## 2020-12-06 NOTE — Assessment & Plan Note (Signed)
Probable UTI  Cipro po x 10 d UA/Cx

## 2020-12-06 NOTE — Assessment & Plan Note (Signed)
Probable UTI 12/21 Cipro po UA/Cx
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
## 2020-12-06 NOTE — Assessment & Plan Note (Signed)
No cancer on bx

## 2020-12-08 LAB — CULTURE, URINE COMPREHENSIVE

## 2020-12-19 DIAGNOSIS — R8290 Unspecified abnormal findings in urine: Secondary | ICD-10-CM | POA: Diagnosis not present

## 2021-01-03 ENCOUNTER — Other Ambulatory Visit: Payer: Self-pay | Admitting: Internal Medicine

## 2021-01-03 NOTE — Telephone Encounter (Signed)
Please refill as per office routine med refill policy (all routine meds refilled for 3 mo or monthly per pt preference up to one year from last visit, then month to month grace period for 3 mo, then further med refills will have to be denied)  

## 2021-01-24 DIAGNOSIS — C641 Malignant neoplasm of right kidney, except renal pelvis: Secondary | ICD-10-CM | POA: Diagnosis not present

## 2021-01-24 DIAGNOSIS — N280 Ischemia and infarction of kidney: Secondary | ICD-10-CM | POA: Diagnosis not present

## 2021-01-24 DIAGNOSIS — N183 Chronic kidney disease, stage 3 unspecified: Secondary | ICD-10-CM | POA: Diagnosis not present

## 2021-01-24 DIAGNOSIS — N2889 Other specified disorders of kidney and ureter: Secondary | ICD-10-CM | POA: Diagnosis not present

## 2021-01-31 DIAGNOSIS — N183 Chronic kidney disease, stage 3 unspecified: Secondary | ICD-10-CM | POA: Diagnosis not present

## 2021-01-31 DIAGNOSIS — E1122 Type 2 diabetes mellitus with diabetic chronic kidney disease: Secondary | ICD-10-CM | POA: Diagnosis not present

## 2021-02-03 ENCOUNTER — Other Ambulatory Visit: Payer: Self-pay | Admitting: Internal Medicine

## 2021-02-03 NOTE — Telephone Encounter (Signed)
Please refill as per office routine med refill policy (all routine meds refilled for 3 mo or monthly per pt preference up to one year from last visit, then month to month grace period for 3 mo, then further med refills will have to be denied)  

## 2021-02-06 DIAGNOSIS — E1122 Type 2 diabetes mellitus with diabetic chronic kidney disease: Secondary | ICD-10-CM | POA: Diagnosis not present

## 2021-02-06 DIAGNOSIS — N183 Chronic kidney disease, stage 3 unspecified: Secondary | ICD-10-CM | POA: Diagnosis not present

## 2021-02-06 DIAGNOSIS — I129 Hypertensive chronic kidney disease with stage 1 through stage 4 chronic kidney disease, or unspecified chronic kidney disease: Secondary | ICD-10-CM | POA: Diagnosis not present

## 2021-02-12 ENCOUNTER — Other Ambulatory Visit: Payer: Self-pay | Admitting: Internal Medicine

## 2021-04-11 DIAGNOSIS — R8290 Unspecified abnormal findings in urine: Secondary | ICD-10-CM | POA: Diagnosis not present

## 2021-04-11 DIAGNOSIS — C641 Malignant neoplasm of right kidney, except renal pelvis: Secondary | ICD-10-CM | POA: Diagnosis not present

## 2021-05-15 ENCOUNTER — Other Ambulatory Visit (INDEPENDENT_AMBULATORY_CARE_PROVIDER_SITE_OTHER): Payer: Medicare Other

## 2021-05-15 DIAGNOSIS — E538 Deficiency of other specified B group vitamins: Secondary | ICD-10-CM | POA: Diagnosis not present

## 2021-05-15 DIAGNOSIS — Z0001 Encounter for general adult medical examination with abnormal findings: Secondary | ICD-10-CM

## 2021-05-15 DIAGNOSIS — E1165 Type 2 diabetes mellitus with hyperglycemia: Secondary | ICD-10-CM

## 2021-05-15 DIAGNOSIS — N1831 Chronic kidney disease, stage 3a: Secondary | ICD-10-CM | POA: Diagnosis not present

## 2021-05-15 DIAGNOSIS — E559 Vitamin D deficiency, unspecified: Secondary | ICD-10-CM | POA: Diagnosis not present

## 2021-05-15 LAB — HEMOGLOBIN A1C: Hgb A1c MFr Bld: 6.7 % — ABNORMAL HIGH (ref 4.6–6.5)

## 2021-05-15 LAB — BASIC METABOLIC PANEL
BUN: 17 mg/dL (ref 6–23)
CO2: 27 mEq/L (ref 19–32)
Calcium: 9.1 mg/dL (ref 8.4–10.5)
Chloride: 103 mEq/L (ref 96–112)
Creatinine, Ser: 1.74 mg/dL — ABNORMAL HIGH (ref 0.40–1.50)
GFR: 38.93 mL/min — ABNORMAL LOW (ref >60.00)
Glucose, Bld: 130 mg/dL — ABNORMAL HIGH (ref 70–99)
Potassium: 4.4 mEq/L (ref 3.5–5.1)
Sodium: 137 mEq/L (ref 135–145)

## 2021-05-15 LAB — CBC WITH DIFFERENTIAL/PLATELET
Basophils Absolute: 0.1 10*3/uL (ref 0.0–0.1)
Basophils Relative: 1.2 % (ref 0.0–3.0)
Eosinophils Absolute: 0.1 10*3/uL (ref 0.0–0.7)
Eosinophils Relative: 1 % (ref 0.0–5.0)
HCT: 41.3 % (ref 39.0–52.0)
Hemoglobin: 13.8 g/dL (ref 13.0–17.0)
Lymphocytes Relative: 38 % (ref 12.0–46.0)
Lymphs Abs: 2.2 10*3/uL (ref 0.7–4.0)
MCHC: 33.4 g/dL (ref 30.0–36.0)
MCV: 84.2 fl (ref 78.0–100.0)
Monocytes Absolute: 0.5 10*3/uL (ref 0.1–1.0)
Monocytes Relative: 9.4 % (ref 3.0–12.0)
Neutro Abs: 2.9 10*3/uL (ref 1.4–7.7)
Neutrophils Relative %: 50.4 % (ref 43.0–77.0)
Platelets: 295 10*3/uL (ref 150.0–400.0)
RBC: 4.91 Mil/uL (ref 4.22–5.81)
RDW: 16.9 % — ABNORMAL HIGH (ref 11.5–15.5)
WBC: 5.8 10*3/uL (ref 4.0–10.5)

## 2021-05-15 LAB — VITAMIN B12: Vitamin B-12: 237 pg/mL (ref 211–911)

## 2021-05-15 LAB — URINALYSIS, ROUTINE W REFLEX MICROSCOPIC
Bilirubin Urine: NEGATIVE
Hgb urine dipstick: NEGATIVE
Ketones, ur: NEGATIVE
Nitrite: NEGATIVE
RBC / HPF: NONE SEEN (ref 0–?)
Specific Gravity, Urine: 1.025 (ref 1.000–1.030)
Total Protein, Urine: NEGATIVE
Urine Glucose: NEGATIVE
Urobilinogen, UA: 0.2 (ref 0.0–1.0)
pH: 5.5 (ref 5.0–8.0)

## 2021-05-15 LAB — LIPID PANEL
Cholesterol: 167 mg/dL (ref 0–200)
HDL: 37.2 mg/dL — ABNORMAL LOW (ref >39.00)
NonHDL: 129.47
Total CHOL/HDL Ratio: 4
Triglycerides: 205 mg/dL — ABNORMAL HIGH (ref 0.0–149.0)
VLDL: 41 mg/dL — ABNORMAL HIGH (ref 0.0–40.0)

## 2021-05-15 LAB — PHOSPHORUS: Phosphorus: 3.3 mg/dL (ref 2.3–4.6)

## 2021-05-15 LAB — HEPATIC FUNCTION PANEL
ALT: 17 U/L (ref 0–53)
AST: 22 U/L (ref 0–37)
Albumin: 4.1 g/dL (ref 3.5–5.2)
Alkaline Phosphatase: 73 U/L (ref 39–117)
Bilirubin, Direct: 0.1 mg/dL (ref 0.0–0.3)
Total Bilirubin: 0.6 mg/dL (ref 0.2–1.2)
Total Protein: 7.2 g/dL (ref 6.0–8.3)

## 2021-05-15 LAB — MICROALBUMIN / CREATININE URINE RATIO
Creatinine,U: 195.1 mg/dL
Microalb Creat Ratio: 1.9 mg/g (ref 0.0–30.0)
Microalb, Ur: 3.7 mg/dL — ABNORMAL HIGH (ref 0.0–1.9)

## 2021-05-15 LAB — VITAMIN D 25 HYDROXY (VIT D DEFICIENCY, FRACTURES): VITD: 40.15 ng/mL (ref 30.00–100.00)

## 2021-05-15 LAB — LDL CHOLESTEROL, DIRECT: Direct LDL: 94 mg/dL

## 2021-05-15 LAB — PSA: PSA: 0.68 ng/mL (ref 0.10–4.00)

## 2021-05-15 LAB — TSH: TSH: 1.65 u[IU]/mL (ref 0.35–4.50)

## 2021-05-17 LAB — PTH, INTACT AND CALCIUM
Calcium: 8.9 mg/dL (ref 8.6–10.3)
PTH: 33 pg/mL (ref 16–77)

## 2021-05-23 ENCOUNTER — Encounter: Payer: Self-pay | Admitting: Internal Medicine

## 2021-05-23 ENCOUNTER — Ambulatory Visit: Payer: Medicare Other | Admitting: Internal Medicine

## 2021-05-23 ENCOUNTER — Ambulatory Visit (INDEPENDENT_AMBULATORY_CARE_PROVIDER_SITE_OTHER): Payer: Medicare Other | Admitting: Internal Medicine

## 2021-05-23 ENCOUNTER — Other Ambulatory Visit: Payer: Self-pay

## 2021-05-23 VITALS — BP 124/78 | HR 65 | Temp 98.3°F | Ht 69.0 in | Wt 202.0 lb

## 2021-05-23 DIAGNOSIS — E559 Vitamin D deficiency, unspecified: Secondary | ICD-10-CM | POA: Diagnosis not present

## 2021-05-23 DIAGNOSIS — E538 Deficiency of other specified B group vitamins: Secondary | ICD-10-CM

## 2021-05-23 DIAGNOSIS — I10 Essential (primary) hypertension: Secondary | ICD-10-CM

## 2021-05-23 DIAGNOSIS — Z0001 Encounter for general adult medical examination with abnormal findings: Secondary | ICD-10-CM

## 2021-05-23 DIAGNOSIS — E119 Type 2 diabetes mellitus without complications: Secondary | ICD-10-CM | POA: Diagnosis not present

## 2021-05-23 DIAGNOSIS — E7800 Pure hypercholesterolemia, unspecified: Secondary | ICD-10-CM

## 2021-05-23 MED ORDER — ATORVASTATIN CALCIUM 80 MG PO TABS: 80.0000 mg | ORAL_TABLET | Freq: Every day | ORAL | 3 refills | Status: DC

## 2021-05-23 NOTE — Assessment & Plan Note (Signed)
Age and sex appropriate education and counseling updated with regular exercise and diet °Referrals for preventative services - none needed °Immunizations addressed - declines shingrix and covid booster °Smoking counseling  - none needed °Evidence for depression or other mood disorder - none significant °Most recent labs reviewed. °I have personally reviewed and have noted: °1) the patient's medical and social history °2) The patient's current medications and supplements °3) The patient's height, weight, and BMI have been recorded in the chart ° °
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
## 2021-05-23 NOTE — Assessment & Plan Note (Signed)
BP Readings from Last 3 Encounters:  05/23/21 124/78  12/06/20 120/80  11/25/20 138/82   Stable, pt to continue medical treatment norvasc, maxide

## 2021-05-23 NOTE — Assessment & Plan Note (Signed)
Uncontrolled, for increased lipitor 80 mg qd, low chol diet

## 2021-05-23 NOTE — Assessment & Plan Note (Signed)
Lab Results  Component Value Date   VITAMINB12 237 05/15/2021   Low normal, to start oral replacement - b12 1000 mcg qd

## 2021-05-23 NOTE — Assessment & Plan Note (Signed)
Lab Results  Component Value Date   HGBA1C 6.7 (H) 05/15/2021   Stable, pt to continue current medical treatment glipizide

## 2021-05-23 NOTE — Patient Instructions (Addendum)
Ok to increase the lipitor to 80 mg per day  Please continue all other medications as before, and refills have been done if requested.  Please have the pharmacy call with any other refills you may need.  Please continue your efforts at being more active, low cholesterol diet, and weight control.  You are otherwise up to date with prevention measures today.  Please keep your appointments with your specialists as you may have planned - urology AND nephrology (kidney doctor)  Please make an Appointment to return in 6 months, or sooner if needed, also with Lab Appointment for testing done 3-5 days before at the [facility] (so this is for TWO appointments - please see the scheduling desk as you leave)  Due to the ongoing Covid 19 pandemic, our lab now requires an appointment for any labs done at our office.  If you need labs done and do not have an appointment, please call our office ahead of time to schedule before presenting to the lab for your testing.

## 2021-07-07 ENCOUNTER — Other Ambulatory Visit: Payer: Self-pay | Admitting: Internal Medicine

## 2021-07-25 DIAGNOSIS — N2889 Other specified disorders of kidney and ureter: Secondary | ICD-10-CM | POA: Diagnosis not present

## 2021-07-25 DIAGNOSIS — C641 Malignant neoplasm of right kidney, except renal pelvis: Secondary | ICD-10-CM | POA: Diagnosis not present

## 2021-07-25 DIAGNOSIS — Z9889 Other specified postprocedural states: Secondary | ICD-10-CM | POA: Diagnosis not present

## 2021-08-01 DIAGNOSIS — N183 Chronic kidney disease, stage 3 unspecified: Secondary | ICD-10-CM | POA: Diagnosis not present

## 2021-08-01 DIAGNOSIS — I129 Hypertensive chronic kidney disease with stage 1 through stage 4 chronic kidney disease, or unspecified chronic kidney disease: Secondary | ICD-10-CM | POA: Diagnosis not present

## 2021-08-08 DIAGNOSIS — I129 Hypertensive chronic kidney disease with stage 1 through stage 4 chronic kidney disease, or unspecified chronic kidney disease: Secondary | ICD-10-CM | POA: Diagnosis not present

## 2021-08-08 DIAGNOSIS — N1832 Chronic kidney disease, stage 3b: Secondary | ICD-10-CM | POA: Diagnosis not present

## 2021-08-08 DIAGNOSIS — E1122 Type 2 diabetes mellitus with diabetic chronic kidney disease: Secondary | ICD-10-CM | POA: Diagnosis not present

## 2021-08-08 DIAGNOSIS — N183 Chronic kidney disease, stage 3 unspecified: Secondary | ICD-10-CM | POA: Diagnosis not present

## 2021-08-08 DIAGNOSIS — D631 Anemia in chronic kidney disease: Secondary | ICD-10-CM | POA: Diagnosis not present

## 2021-09-04 ENCOUNTER — Other Ambulatory Visit: Payer: Self-pay

## 2021-09-04 ENCOUNTER — Ambulatory Visit
Admission: EM | Admit: 2021-09-04 | Discharge: 2021-09-04 | Disposition: A | Discharge status: Home / Self Care | Payer: Medicare Other

## 2021-09-04 ENCOUNTER — Emergency Department (HOSPITAL_BASED_OUTPATIENT_CLINIC_OR_DEPARTMENT_OTHER)
Admission: EM | Admit: 2021-09-04 | Discharge: 2021-09-04 | Disposition: A | Discharge status: Home / Self Care | Payer: Medicare Other | Attending: Emergency Medicine | Admitting: Emergency Medicine

## 2021-09-04 ENCOUNTER — Emergency Department (HOSPITAL_BASED_OUTPATIENT_CLINIC_OR_DEPARTMENT_OTHER): Payer: Medicare Other

## 2021-09-04 ENCOUNTER — Encounter: Payer: Self-pay | Admitting: Emergency Medicine

## 2021-09-04 ENCOUNTER — Encounter (HOSPITAL_BASED_OUTPATIENT_CLINIC_OR_DEPARTMENT_OTHER): Payer: Self-pay

## 2021-09-04 DIAGNOSIS — E1122 Type 2 diabetes mellitus with diabetic chronic kidney disease: Secondary | ICD-10-CM | POA: Diagnosis not present

## 2021-09-04 DIAGNOSIS — R519 Headache, unspecified: Secondary | ICD-10-CM

## 2021-09-04 DIAGNOSIS — Z87891 Personal history of nicotine dependence: Secondary | ICD-10-CM | POA: Insufficient documentation

## 2021-09-04 DIAGNOSIS — I160 Hypertensive urgency: Secondary | ICD-10-CM

## 2021-09-04 DIAGNOSIS — I10 Essential (primary) hypertension: Secondary | ICD-10-CM

## 2021-09-04 DIAGNOSIS — Z85528 Personal history of other malignant neoplasm of kidney: Secondary | ICD-10-CM | POA: Diagnosis not present

## 2021-09-04 DIAGNOSIS — N183 Chronic kidney disease, stage 3 unspecified: Secondary | ICD-10-CM | POA: Diagnosis not present

## 2021-09-04 DIAGNOSIS — I129 Hypertensive chronic kidney disease with stage 1 through stage 4 chronic kidney disease, or unspecified chronic kidney disease: Secondary | ICD-10-CM | POA: Insufficient documentation

## 2021-09-04 DIAGNOSIS — G44209 Tension-type headache, unspecified, not intractable: Secondary | ICD-10-CM

## 2021-09-04 LAB — CBC WITH DIFFERENTIAL/PLATELET
Abs Immature Granulocytes: 0.02 10*3/uL (ref 0.00–0.07)
Basophils Absolute: 0 10*3/uL (ref 0.0–0.1)
Basophils Relative: 1 %
Eosinophils Absolute: 0 10*3/uL (ref 0.0–0.5)
Eosinophils Relative: 0 %
HCT: 43.6 % (ref 39.0–52.0)
Hemoglobin: 14.4 g/dL (ref 13.0–17.0)
Immature Granulocytes: 0 %
Lymphocytes Relative: 39 %
Lymphs Abs: 2.3 10*3/uL (ref 0.7–4.0)
MCH: 28.2 pg (ref 26.0–34.0)
MCHC: 33 g/dL (ref 30.0–36.0)
MCV: 85.3 fL (ref 80.0–100.0)
Monocytes Absolute: 0.5 10*3/uL (ref 0.1–1.0)
Monocytes Relative: 9 %
Neutro Abs: 3.1 10*3/uL (ref 1.7–7.7)
Neutrophils Relative %: 51 %
Platelets: 316 10*3/uL (ref 150–400)
RBC: 5.11 MIL/uL (ref 4.22–5.81)
RDW: 16.5 % — ABNORMAL HIGH (ref 11.5–15.5)
WBC: 6 10*3/uL (ref 4.0–10.5)
nRBC: 0 % (ref 0.0–0.2)

## 2021-09-04 LAB — COMPREHENSIVE METABOLIC PANEL
ALT: 19 U/L (ref 0–44)
AST: 24 U/L (ref 15–41)
Albumin: 4.2 g/dL (ref 3.5–5.0)
Alkaline Phosphatase: 65 U/L (ref 38–126)
Anion gap: 8 (ref 5–15)
BUN: 15 mg/dL (ref 8–23)
CO2: 28 mmol/L (ref 22–32)
Calcium: 8.9 mg/dL (ref 8.9–10.3)
Chloride: 100 mmol/L (ref 98–111)
Creatinine, Ser: 1.88 mg/dL — ABNORMAL HIGH (ref 0.61–1.24)
GFR, Estimated: 38 mL/min — ABNORMAL LOW (ref >60)
Glucose, Bld: 88 mg/dL (ref 70–99)
Potassium: 4 mmol/L (ref 3.5–5.1)
Sodium: 136 mmol/L (ref 135–145)
Total Bilirubin: 0.7 mg/dL (ref 0.3–1.2)
Total Protein: 8.1 g/dL (ref 6.5–8.1)

## 2021-09-04 LAB — LIPASE, BLOOD: Lipase: 39 U/L (ref 11–51)

## 2021-09-04 MED ORDER — AMLODIPINE BESYLATE 10 MG PO TABS: ORAL_TABLET | ORAL | 0 refills | Status: DC

## 2021-09-04 MED ORDER — HYDRALAZINE HCL 10 MG PO TABS
10.0000 mg | ORAL_TABLET | Freq: Once | ORAL | Status: AC
  Administered 2021-09-04: 10 mg via ORAL
  Filled 2021-09-04: qty 1

## 2021-09-04 NOTE — Discharge Instructions (Signed)
Please go to the emergency room now as you will be needing a head CT scan due to your bad headache and severely elevated blood pressure. Your family member can drive you there as you are not showing active signs of a stroke or possible aneurysm. Do not go home, go straight to the hospital.
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
## 2021-09-04 NOTE — Discharge Instructions (Signed)
You were seen in the emergency department today with elevated blood pressure and headache.  Your CT scan and lab work is reassuring.  I am increasing your amlodipine dose but please follow closely with your PCP on Wednesday for further evaluation.

## 2021-09-04 NOTE — ED Triage Notes (Signed)
Pt c/o intermittent HA x ~1 month-seen/sent UC today-denies fever/flu sx-NAD-steady gait
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
## 2021-09-04 NOTE — ED Triage Notes (Addendum)
Headache x 2 days, denies past history of headaches. Took tylenol this morning with improvement. Wife told him to come here due to his blood pressure likely being elevated. Did not take his blood pressure meds prior to arrival. 178/81 in triage

## 2021-09-06 ENCOUNTER — Ambulatory Visit (INDEPENDENT_AMBULATORY_CARE_PROVIDER_SITE_OTHER): Payer: Medicare Other | Admitting: Internal Medicine

## 2021-09-06 ENCOUNTER — Other Ambulatory Visit: Payer: Self-pay

## 2021-09-06 ENCOUNTER — Encounter: Payer: Self-pay | Admitting: Internal Medicine

## 2021-09-06 VITALS — BP 124/80 | HR 54 | Temp 98.6°F | Resp 18 | Ht 69.0 in | Wt 202.8 lb

## 2021-09-06 DIAGNOSIS — F32A Depression, unspecified: Secondary | ICD-10-CM

## 2021-09-06 DIAGNOSIS — E1165 Type 2 diabetes mellitus with hyperglycemia: Secondary | ICD-10-CM | POA: Diagnosis not present

## 2021-09-06 DIAGNOSIS — I10 Essential (primary) hypertension: Secondary | ICD-10-CM | POA: Diagnosis not present

## 2021-09-06 NOTE — Patient Instructions (Signed)
Your BP and headaches have improved  Please continue all other medications as before, and refills have been done if requested.  Please have the pharmacy call with any other refills you may need.  Please continue your efforts at being more active, low cholesterol diet, and weight control.  Please keep your appointments with your specialists as you may have planned  Ok to return with your wife at your usual appointment time
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
## 2021-09-07 ENCOUNTER — Encounter: Payer: Self-pay | Admitting: Internal Medicine

## 2021-09-07 NOTE — Assessment & Plan Note (Signed)
Stable overall, no SI or HI, declines new med tx at this time or referral to psychiatry

## 2021-09-07 NOTE — Assessment & Plan Note (Signed)
BP Readings from Last 3 Encounters:  09/06/21 124/80  09/04/21 (!) 180/94  09/04/21 (!) 178/81   Stable, pt to continue medical treatment amlodipine, maxide

## 2021-09-07 NOTE — Assessment & Plan Note (Signed)
Lab Results  Component Value Date   HGBA1C 6.7 (H) 05/15/2021   Stable, pt to continue current medical treatment glipizide

## 2021-09-08 ENCOUNTER — Other Ambulatory Visit: Payer: Self-pay | Admitting: Internal Medicine

## 2021-09-08 NOTE — Telephone Encounter (Signed)
Please refill as per office routine med refill policy (all routine meds to be refilled for 3 mo or monthly (per pt preference) up to one year from last visit, then month to month grace period for 3 mo, then further med refills will have to be denied) ? ?

## 2021-10-11 ENCOUNTER — Telehealth: Payer: Self-pay

## 2021-10-11 MED ORDER — AMLODIPINE BESYLATE 10 MG PO TABS: ORAL_TABLET | ORAL | 3 refills | Status: DC

## 2021-10-11 NOTE — Telephone Encounter (Signed)
1.Medication Requested: Amlodipine  2. Pharmacy (Name, Street, [place]): [facility] on [address]  3. On Med List: yes  4. Last Visit with PCP: [id number]     Agent: Please be advised that RX refills may take up to 3 business days. We ask that you follow-up with your pharmacy.

## 2021-10-12 DIAGNOSIS — C641 Malignant neoplasm of right kidney, except renal pelvis: Secondary | ICD-10-CM | POA: Diagnosis not present

## 2021-10-12 DIAGNOSIS — J439 Emphysema, unspecified: Secondary | ICD-10-CM | POA: Diagnosis not present

## 2021-10-13 DIAGNOSIS — N138 Other obstructive and reflux uropathy: Secondary | ICD-10-CM | POA: Diagnosis not present

## 2021-10-13 DIAGNOSIS — C641 Malignant neoplasm of right kidney, except renal pelvis: Secondary | ICD-10-CM | POA: Diagnosis not present

## 2021-11-10 DIAGNOSIS — M1712 Unilateral primary osteoarthritis, left knee: Secondary | ICD-10-CM | POA: Diagnosis not present

## 2021-11-22 DIAGNOSIS — M62838 Other muscle spasm: Secondary | ICD-10-CM | POA: Diagnosis not present

## 2021-11-22 DIAGNOSIS — G2581 Restless legs syndrome: Secondary | ICD-10-CM | POA: Diagnosis not present

## 2021-11-22 DIAGNOSIS — E785 Hyperlipidemia, unspecified: Secondary | ICD-10-CM | POA: Diagnosis not present

## 2021-11-22 DIAGNOSIS — I129 Hypertensive chronic kidney disease with stage 1 through stage 4 chronic kidney disease, or unspecified chronic kidney disease: Secondary | ICD-10-CM | POA: Diagnosis not present

## 2021-11-22 DIAGNOSIS — N1832 Chronic kidney disease, stage 3b: Secondary | ICD-10-CM | POA: Diagnosis not present

## 2021-11-22 DIAGNOSIS — E1122 Type 2 diabetes mellitus with diabetic chronic kidney disease: Secondary | ICD-10-CM | POA: Diagnosis not present

## 2021-11-22 DIAGNOSIS — E878 Other disorders of electrolyte and fluid balance, not elsewhere classified: Secondary | ICD-10-CM | POA: Diagnosis not present

## 2021-11-23 ENCOUNTER — Encounter: Payer: Self-pay | Admitting: Internal Medicine

## 2021-11-23 ENCOUNTER — Ambulatory Visit (INDEPENDENT_AMBULATORY_CARE_PROVIDER_SITE_OTHER): Payer: Medicare Other | Admitting: Internal Medicine

## 2021-11-23 VITALS — BP 138/72 | HR 58 | Temp 98.3°F | Ht 69.0 in | Wt 209.0 lb

## 2021-11-23 DIAGNOSIS — R509 Fever, unspecified: Secondary | ICD-10-CM

## 2021-11-23 DIAGNOSIS — E538 Deficiency of other specified B group vitamins: Secondary | ICD-10-CM

## 2021-11-23 DIAGNOSIS — N1831 Chronic kidney disease, stage 3a: Secondary | ICD-10-CM

## 2021-11-23 DIAGNOSIS — I129 Hypertensive chronic kidney disease with stage 1 through stage 4 chronic kidney disease, or unspecified chronic kidney disease: Secondary | ICD-10-CM | POA: Diagnosis not present

## 2021-11-23 DIAGNOSIS — R059 Cough, unspecified: Secondary | ICD-10-CM

## 2021-11-23 DIAGNOSIS — E1165 Type 2 diabetes mellitus with hyperglycemia: Secondary | ICD-10-CM

## 2021-11-23 DIAGNOSIS — J209 Acute bronchitis, unspecified: Secondary | ICD-10-CM | POA: Diagnosis not present

## 2021-11-23 DIAGNOSIS — N181 Chronic kidney disease, stage 1: Secondary | ICD-10-CM | POA: Diagnosis not present

## 2021-11-23 DIAGNOSIS — E7800 Pure hypercholesterolemia, unspecified: Secondary | ICD-10-CM

## 2021-11-23 DIAGNOSIS — E559 Vitamin D deficiency, unspecified: Secondary | ICD-10-CM | POA: Diagnosis not present

## 2021-11-23 DIAGNOSIS — F32A Depression, unspecified: Secondary | ICD-10-CM | POA: Diagnosis not present

## 2021-11-23 DIAGNOSIS — I10 Essential (primary) hypertension: Secondary | ICD-10-CM | POA: Diagnosis not present

## 2021-11-23 DIAGNOSIS — Z0000 Encounter for general adult medical examination without abnormal findings: Secondary | ICD-10-CM

## 2021-11-23 DIAGNOSIS — I491 Atrial premature depolarization: Secondary | ICD-10-CM | POA: Diagnosis not present

## 2021-11-23 LAB — POCT GLYCOSYLATED HEMOGLOBIN (HGB A1C): Hemoglobin A1C: 6.3 % — AB (ref 4.0–5.6)
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
## 2021-11-23 NOTE — Patient Instructions (Signed)
Your A1c was done today  Please continue all other medications as before, and refills have been done if requested.  Please have the pharmacy call with any other refills you may need.  Please continue your efforts at being more active, low cholesterol diet, and weight control  Please keep your appointments with your specialists as you may have planned  Please make an Appointment to return in 3 months, or sooner if needed, also with Lab Appointment for testing done 3-5 days before at the [facility] (so this is for TWO appointments - please see the scheduling desk as you leave)  Due to the ongoing Covid 19 pandemic, our lab now requires an appointment for any labs done at our office.  If you need labs done and do not have an appointment, please call our office ahead of time to schedule before presenting to the lab for your testing.

## 2021-11-26 ENCOUNTER — Encounter: Payer: Self-pay | Admitting: Internal Medicine

## 2021-11-26 MED ORDER — TAMSULOSIN HCL 0.4 MG PO CAPS: ORAL_CAPSULE | ORAL | 3 refills | Status: DC

## 2021-11-26 MED ORDER — GLIPIZIDE ER 5 MG PO TB24: ORAL_TABLET | ORAL | 3 refills | Status: DC

## 2021-11-26 MED ORDER — OMEPRAZOLE 20 MG PO CPDR: DELAYED_RELEASE_CAPSULE | ORAL | 3 refills | Status: DC

## 2021-11-26 MED ORDER — AMLODIPINE BESYLATE 10 MG PO TABS: ORAL_TABLET | ORAL | 3 refills | Status: DC

## 2021-11-26 MED ORDER — TRIAMTERENE-HCTZ 37.5-25 MG PO TABS: 1.0000 | ORAL_TABLET | Freq: Every day | ORAL | 3 refills | Status: DC

## 2021-11-26 MED ORDER — ATORVASTATIN CALCIUM 80 MG PO TABS: 80.0000 mg | ORAL_TABLET | Freq: Every day | ORAL | 3 refills | Status: DC

## 2021-11-26 NOTE — Assessment & Plan Note (Signed)
Lab Results  Component Value Date   VITAMINB12 237 05/15/2021   Stable, cont oral replacement - b12 1000 mcg qd

## 2021-11-26 NOTE — Assessment & Plan Note (Signed)
Lab Results  Component Value Date   LDLCALC 105 (H) 11/16/2020   Uncontrolled, goal ldl < 70, pt to continue current statin lipitor 80, declines add zetia or lipid clinic

## 2021-11-26 NOTE — Assessment & Plan Note (Signed)
Lab Results  Component Value Date   HGBA1C 6.3 (A) 11/23/2021   Stable, pt to continue current medical treatment glucotrol

## 2021-11-26 NOTE — Assessment & Plan Note (Signed)
Lab Results  Component Value Date   CREATININE 1.88 (H) 09/04/2021   Stable overall, cont to avoid nephrotoxins

## 2021-11-26 NOTE — Assessment & Plan Note (Signed)
Stable overall , declines need for further change in tx such as counseling

## 2021-11-26 NOTE — Assessment & Plan Note (Signed)
BP Readings from Last 3 Encounters:  11/23/21 138/72  09/06/21 124/80  09/04/21 (!) 180/94   Stable, pt to continue medical treatment norvasc, maxide

## 2022-01-04 ENCOUNTER — Telehealth: Payer: Self-pay

## 2022-01-04 NOTE — Telephone Encounter (Signed)
Pt wife calling in to request refill on behave of pt for: omeprazole (PRILOSEC) 20 MG capsule  LOV: 11/23/21  Pharmacy: [facility] [place], [place] AT [address] [phone]

## 2022-01-04 NOTE — Telephone Encounter (Signed)
Patient's wife notified that prescription was sent December 2022 for a year supply. Patient's wife will contact pharmacy

## 2022-01-05 ENCOUNTER — Other Ambulatory Visit: Payer: Self-pay | Admitting: Internal Medicine

## 2022-01-05 NOTE — Telephone Encounter (Signed)
Please refill as per office routine med refill policy (all routine meds to be refilled for 3 mo or monthly (per pt preference) up to one year from last visit, then month to month grace period for 3 mo, then further med refills will have to be denied) ? ?

## 2022-01-14 DIAGNOSIS — C641 Malignant neoplasm of right kidney, except renal pelvis: Secondary | ICD-10-CM | POA: Diagnosis not present

## 2022-01-16 DIAGNOSIS — C641 Malignant neoplasm of right kidney, except renal pelvis: Secondary | ICD-10-CM | POA: Diagnosis not present

## 2022-01-21 ENCOUNTER — Emergency Department (HOSPITAL_BASED_OUTPATIENT_CLINIC_OR_DEPARTMENT_OTHER): Payer: Medicare Other

## 2022-01-21 ENCOUNTER — Encounter (HOSPITAL_BASED_OUTPATIENT_CLINIC_OR_DEPARTMENT_OTHER): Payer: Self-pay | Admitting: Emergency Medicine

## 2022-01-21 ENCOUNTER — Emergency Department (HOSPITAL_BASED_OUTPATIENT_CLINIC_OR_DEPARTMENT_OTHER)
Admission: EM | Admit: 2022-01-21 | Discharge: 2022-01-21 | Disposition: A | Discharge status: Home / Self Care | Payer: Medicare Other | Attending: Emergency Medicine | Admitting: Emergency Medicine

## 2022-01-21 ENCOUNTER — Other Ambulatory Visit: Payer: Self-pay

## 2022-01-21 DIAGNOSIS — I10 Essential (primary) hypertension: Secondary | ICD-10-CM | POA: Diagnosis not present

## 2022-01-21 DIAGNOSIS — M25562 Pain in left knee: Secondary | ICD-10-CM | POA: Diagnosis not present

## 2022-01-21 DIAGNOSIS — E119 Type 2 diabetes mellitus without complications: Secondary | ICD-10-CM | POA: Insufficient documentation

## 2022-01-21 DIAGNOSIS — Z79899 Other long term (current) drug therapy: Secondary | ICD-10-CM | POA: Diagnosis not present

## 2022-01-21 DIAGNOSIS — Z7984 Long term (current) use of oral hypoglycemic drugs: Secondary | ICD-10-CM | POA: Insufficient documentation

## 2022-01-21 DIAGNOSIS — M1712 Unilateral primary osteoarthritis, left knee: Secondary | ICD-10-CM | POA: Diagnosis not present

## 2022-01-21 DIAGNOSIS — M1990 Unspecified osteoarthritis, unspecified site: Secondary | ICD-10-CM

## 2022-01-21 MED ORDER — LIDOCAINE 5 % EX PTCH
1.0000 | MEDICATED_PATCH | CUTANEOUS | Status: DC
  Administered 2022-01-21: 1 via TRANSDERMAL
  Filled 2022-01-21: qty 1

## 2022-01-21 MED ORDER — OXYCODONE HCL 5 MG PO TABS: 5.0000 mg | ORAL_TABLET | ORAL | 0 refills | Status: AC | PRN

## 2022-01-21 NOTE — Discharge Instructions (Addendum)
You may take Tylenol (acetaminophen) 1000 mg 4 times a day for 1 week. This is the maximum dose of Tylenol usually take from all sources. Please check other over-the-counter medications and prescriptions to ensure you are not taking other medications that contain acetaminophen.  I also recommend over the counter lidocaine patch to your left knee for pain. Take oxycodone as needed for breakthrough pain.  This medication can be addicting, sedating and cause constipation.

## 2022-01-21 NOTE — ED Triage Notes (Signed)
Pt reports L knee pain. Has had issues with this knee before but not this bad. Pt has the worst pain on the inside of knee accompanied by swelling. No known injury.

## 2022-01-21 NOTE — ED Provider Notes (Signed)
[facility] EMERGENCY DEPARTMENT Provider Note   CSN: [id number] Arrival date & time: 01/21/22  1014     History  Chief Complaint  Patient presents with   Knee Pain    Calvin Wilcox is a 71 y.o. male.  HPI     71 year old male with a history of paranoid schizophrenia, hypertension, hyperlipidemia, depression, restless leg syndrome, diabetes, presents a concern for knee pain.  Denies any injuries.  Has had some issues with knee pain in the past, however pain has never been this severe.  Pain has been present over the last 2 days and is worse on the medial portion of the knee.  It is worse with ambulation.[name]  Has not had fever, nausea, vomiting, shortness of breath or chest pain.  The pain is localized to the knee,   Past Medical History:  Diagnosis Date   BPH (benign prostatic hyperplasia) 10/28/2012   Depression    ED (erectile dysfunction)    GERD (gastroesophageal reflux disease)    HTN (hypertension)    Hyperlipidemia    Impaired glucose tolerance 02/11/2014   Long Q-T syndrome    intermittent   Paranoid schizophrenia (HCC)    RLS (restless legs syndrome) 12/01/2014   Type II or unspecified type diabetes mellitus without mention of complication, uncontrolled 02/11/2014     Home Medications Prior to Admission medications   Medication Sig Start Date End Date Taking? Authorizing Provider  oxyCODONE (ROXICODONE) 5 MG immediate release tablet Take 1 tablet (5 mg total) by mouth every 4 (four) hours as needed for severe pain. 01/21/22  Yes Gareth Morgan, MD  albuterol (PROVENTIL HFA;VENTOLIN HFA) 108 (90 Base) MCG/ACT inhaler Inhale 2 puffs into the lungs every 6 (six) hours as needed for wheezing or shortness of breath. 12/14/16   Flossie Buffy, NP  amLODipine (NORVASC) 10 MG tablet TAKE 1 TABLET BY MOUTH DAILY 11/26/21   Biagio Borg, MD  atorvastatin (LIPITOR) 80 MG tablet Take 1 tablet (80 mg total) by mouth daily. 11/26/21   Biagio Borg, MD   Cholecalciferol 50 MCG (2000 UT) TABS Take by mouth. 05/31/20   [provider]  fluticasone Asencion Islam) 50 MCG/ACT nasal spray SHAKE LIQUID AND USE 2 SPRAYS IN [facility] NOSTRIL DAILY 12/17/16   Biagio Borg, MD  furosemide (LASIX) 20 MG tablet Take by mouth. 10/04/20   [provider]  glipiZIDE (GLUCOTROL XL) 5 MG 24 hr tablet TAKE 1 TABLET(5 MG) BY MOUTH DAILY WITH BREAKFAST 11/26/21   Biagio Borg, MD  iron polysaccharides (NU-IRON) 150 MG capsule Take 1 capsule (150 mg total) by mouth daily. 11/19/19   Biagio Borg, MD  omeprazole (PRILOSEC) 20 MG capsule TAKE 1 CAPSULE(20 MG) BY MOUTH TWICE DAILY 01/05/22   Biagio Borg, MD  senna-docusate (SENOKOT-S) 8.6-50 MG tablet Take by mouth. 10/06/20   [provider]  tamsulosin (FLOMAX) 0.4 MG CAPS capsule TAKE 1 CAPSULE(0.4 MG) BY MOUTH DAILY 11/26/21   Biagio Borg, MD  triamterene-hydrochlorothiazide (MAXZIDE-25) 37.5-25 MG tablet Take 1 tablet by mouth daily. 11/26/21   Biagio Borg, MD      Allergies    Patient has no known allergies.    Review of Systems   Review of Systems  Physical Exam Updated Vital Signs BP 131/89    Pulse (!) 54    Temp 98.1 F (36.7 C) (Oral)    Resp 20    SpO2 99%  Physical Exam Vitals and nursing note reviewed.  Constitutional:  General: He is not in acute distress.    Appearance: Normal appearance. He is not ill-appearing, toxic-appearing or diaphoretic.  HENT:     Head: Normocephalic.  Eyes:     Conjunctiva/sclera: Conjunctivae normal.  Cardiovascular:     Rate and Rhythm: Normal rate and regular rhythm.     Pulses: Normal pulses.  Pulmonary:     Effort: Pulmonary effort is normal. No respiratory distress.  Musculoskeletal:        General: Tenderness (medial knee, neg anterior/posterior drawers,n oinstabilit laterally, normal ROM) present. No deformity or signs of injury.     Cervical back: No rigidity.  Skin:    General: Skin is warm and dry.     Coloration: Skin is  not jaundiced or pale.  Neurological:     General: No focal deficit present.     Mental Status: He is alert and oriented to person, place, and time.    ED Results / Procedures / Treatments   Labs (all labs ordered are listed, but only abnormal results are displayed) Labs Reviewed - No data to display  EKG None  Radiology DG Knee Complete 4 Views Left  Result Date: 01/21/2022 CLINICAL DATA:  Left knee pain.  No known injury. EXAM: LEFT KNEE - COMPLETE 4+ VIEW COMPARISON:  None. FINDINGS: Loss of joint space noted medial compartment. Hypertrophic spurring noted in all 3 compartments. No evidence for an acute fracture or dislocation. No joint effusion. IMPRESSION: Tricompartmental degenerative changes without acute findings. Electronically Signed   By: Misty Stanley M.D.   On: 01/21/2022 11:10    Procedures Procedures    Medications Ordered in ED Medications - No data to display  ED Course/ Medical Decision Making/ A&P                           Medical Decision Making Amount and/or Complexity of Data Reviewed Radiology: ordered.  Risk Prescription drug management.    71 year old male with a history of paranoid schizophrenia, hypertension, hyperlipidemia, depression, restless leg syndrome, diabetes, presents a concern for knee pain.  He has normal pulses bilaterally, no sign of acute arterial thrombus.  No asymmetric leg swelling, or calf pain to suggest DVT.  Pain is localized about the knee.  No fever, no joint effusion, however suspicion for septic arthritis, and also low suspicion for gout.  X-ray of the knee was performed which shows tricompartmental degenerative changes without acute findings.  Suspect pain is likely an exacerbation of osteoarthritis, has loss of joint space medial compartment.    Recommend lidocaine patch, tylenol, and given rx for oxycodone for breakthrough pain> Recommend follow up with Orthopedic and PCP. Patient discharged in stable condition with  understanding of reasons to return.         Final Clinical Impression(s) / ED Diagnoses Final diagnoses:  Acute pain of left knee  Arthritis    Rx / DC Orders ED Discharge Orders          Ordered    oxyCODONE (ROXICODONE) 5 MG immediate release tablet  Every 4 hours PRN        01/21/22 1227              Gareth Morgan, MD 01/22/22 2203

## 2022-01-22 DIAGNOSIS — M1712 Unilateral primary osteoarthritis, left knee: Secondary | ICD-10-CM | POA: Diagnosis not present

## 2022-01-23 DIAGNOSIS — I129 Hypertensive chronic kidney disease with stage 1 through stage 4 chronic kidney disease, or unspecified chronic kidney disease: Secondary | ICD-10-CM | POA: Diagnosis not present

## 2022-01-23 DIAGNOSIS — N183 Chronic kidney disease, stage 3 unspecified: Secondary | ICD-10-CM | POA: Diagnosis not present

## 2022-01-30 DIAGNOSIS — E538 Deficiency of other specified B group vitamins: Secondary | ICD-10-CM | POA: Diagnosis not present

## 2022-01-30 DIAGNOSIS — N1832 Chronic kidney disease, stage 3b: Secondary | ICD-10-CM | POA: Diagnosis not present

## 2022-01-30 DIAGNOSIS — N183 Chronic kidney disease, stage 3 unspecified: Secondary | ICD-10-CM | POA: Diagnosis not present

## 2022-01-30 DIAGNOSIS — D631 Anemia in chronic kidney disease: Secondary | ICD-10-CM | POA: Diagnosis not present

## 2022-01-30 DIAGNOSIS — I129 Hypertensive chronic kidney disease with stage 1 through stage 4 chronic kidney disease, or unspecified chronic kidney disease: Secondary | ICD-10-CM | POA: Diagnosis not present

## 2022-01-30 DIAGNOSIS — E1122 Type 2 diabetes mellitus with diabetic chronic kidney disease: Secondary | ICD-10-CM | POA: Diagnosis not present

## 2022-02-16 IMAGING — CT CT HEAD W/O CM
3 series · 15 of 47 positions shown, 18 images · non-contrast
Comparison: None.

CLINICAL DATA: Right frontal headache

EXAM:
CT HEAD WITHOUT CONTRAST
TECHNIQUE: Contiguous axial images were obtained from the base of the skull
through the vertex without intravenous contrast.

[Series 2: head wo · axial · 0.42mm/px · z∈[-144,-14]mm · 9 of 32 slices shown, 12 images]
[im 3/32  brain]
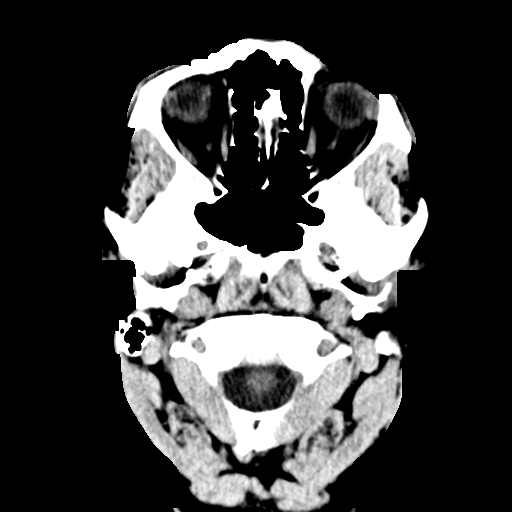
[im 3/32  bone]
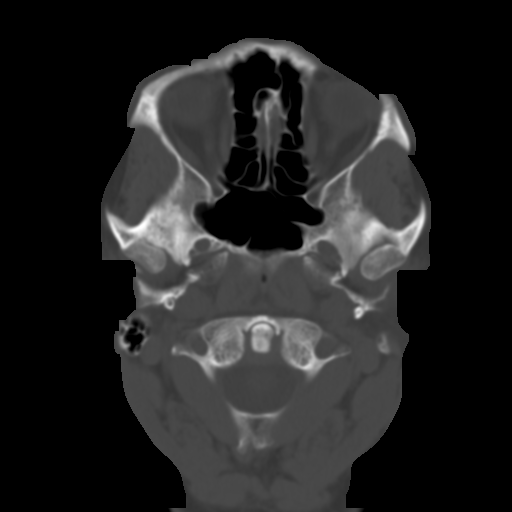
[im 6/32  brain]
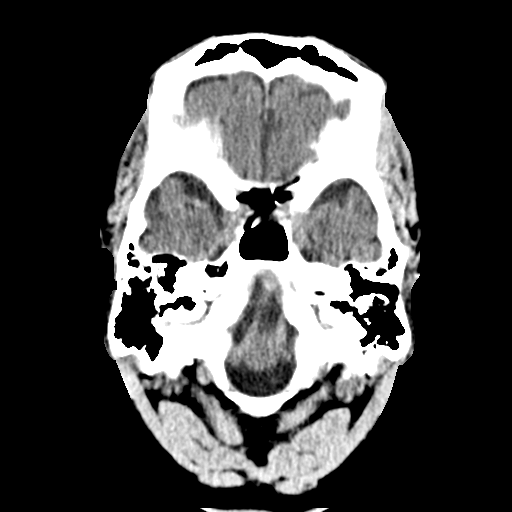
[im 9/32  brain]
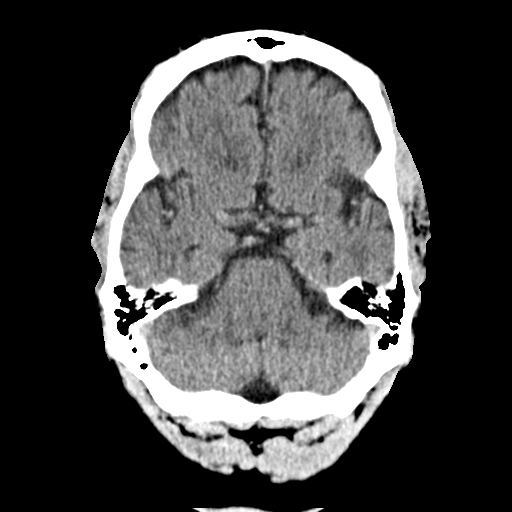
[im 12/32  brain]
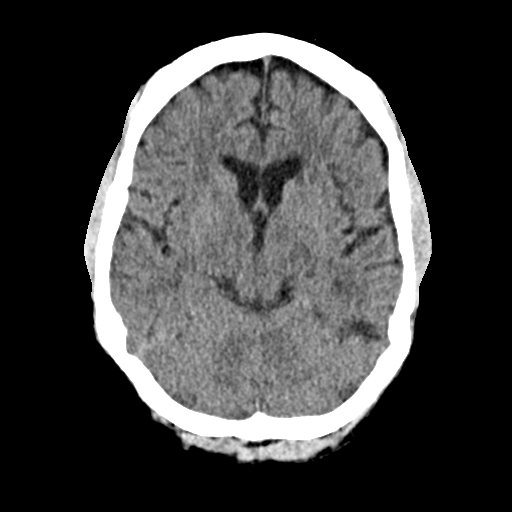
[im 17/32  brain]
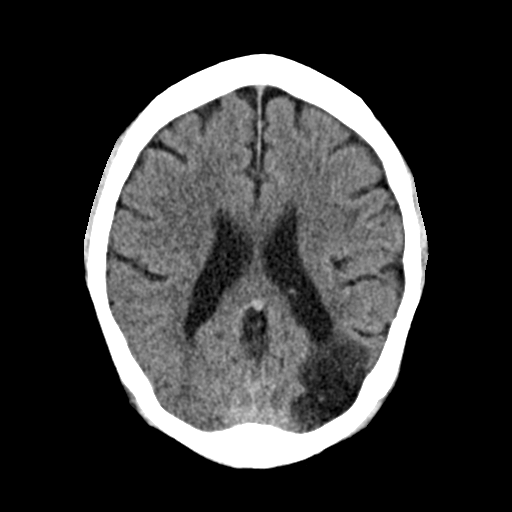
[im 17/32  bone]
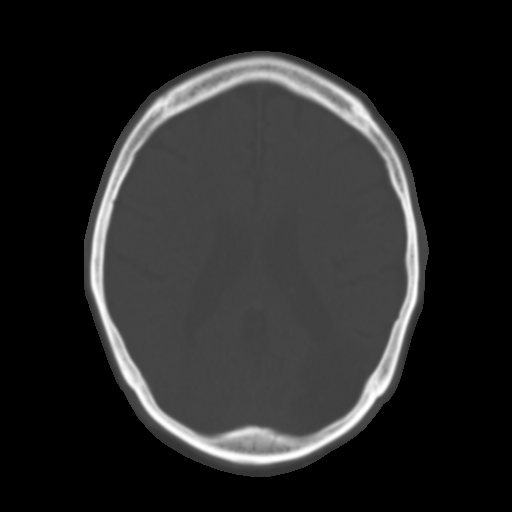
[im 20/32  brain]
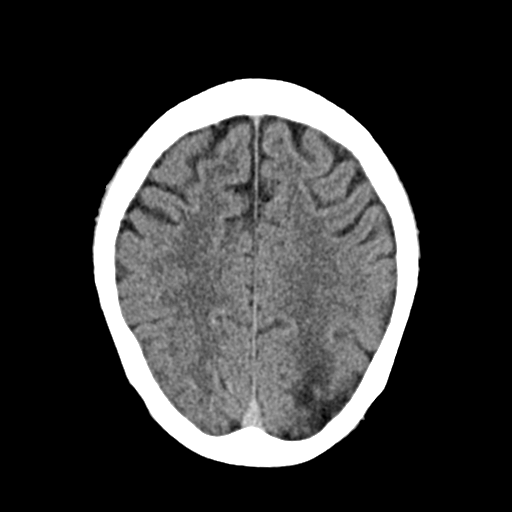
[im 23/32  brain]
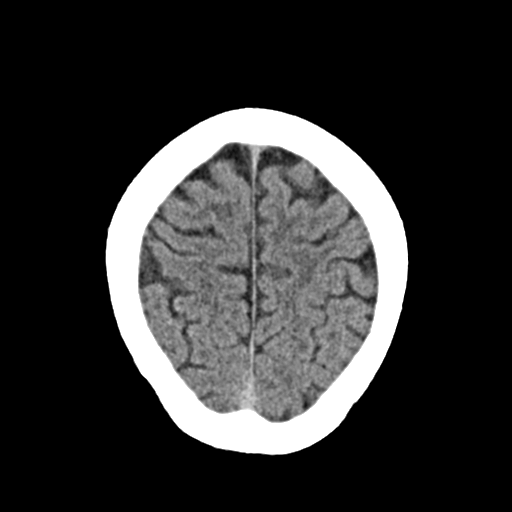
[im 26/32  brain]
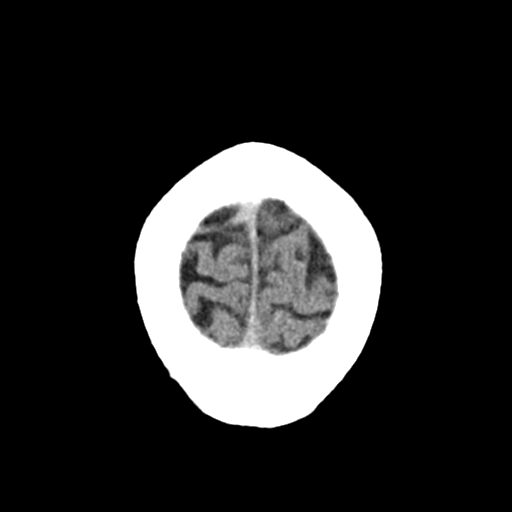
[im 29/32  brain]
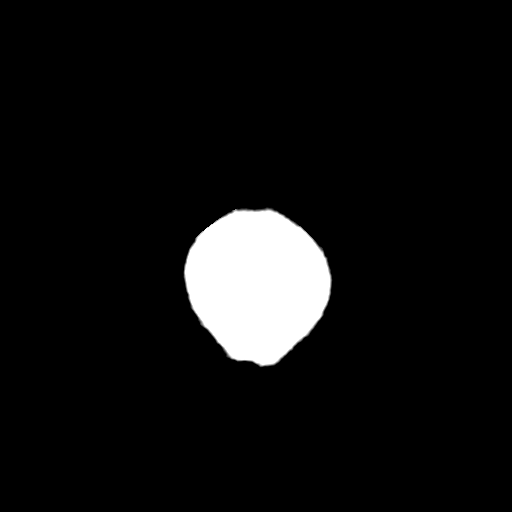
[im 29/32  bone]
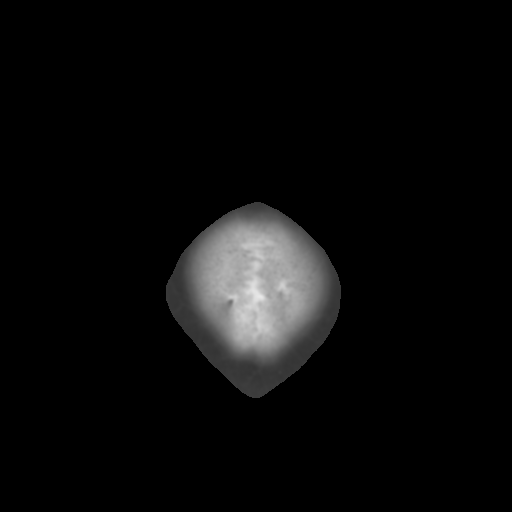

[Series 4: coronal soft · coronal · 0.32mm/px · 3 of 70 slices shown]
[im 24/70  brain]
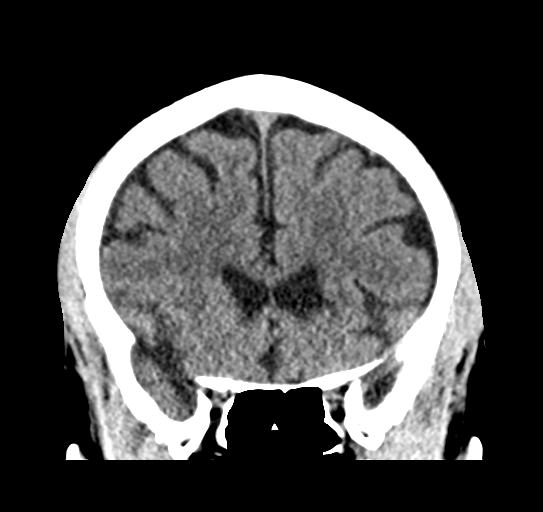
[im 31/70  brain]
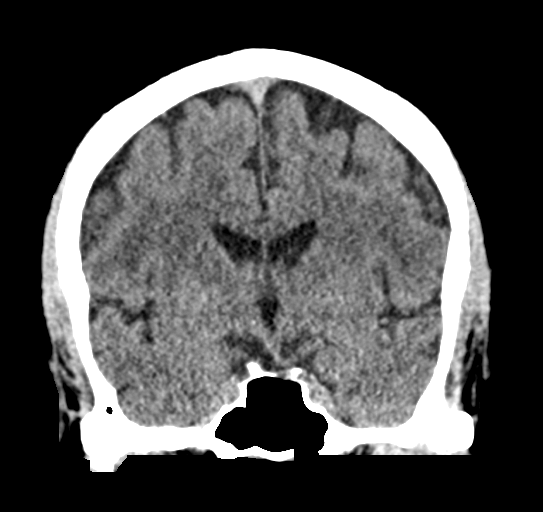
[im 39/70  brain]
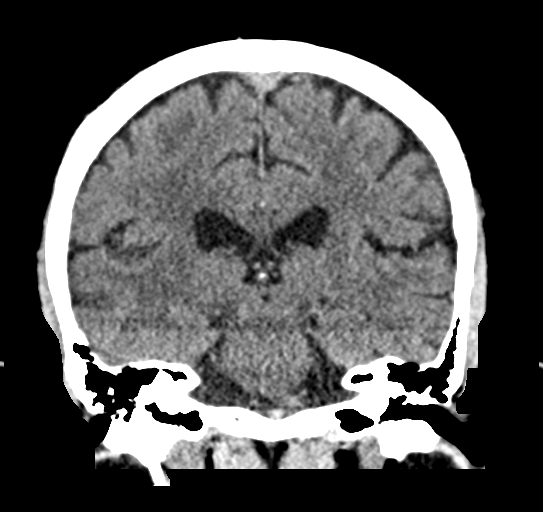

[Series 5: sag soft · sagittal · 0.31mm/px · 3 of 56 slices shown]
[im 19/56  brain]
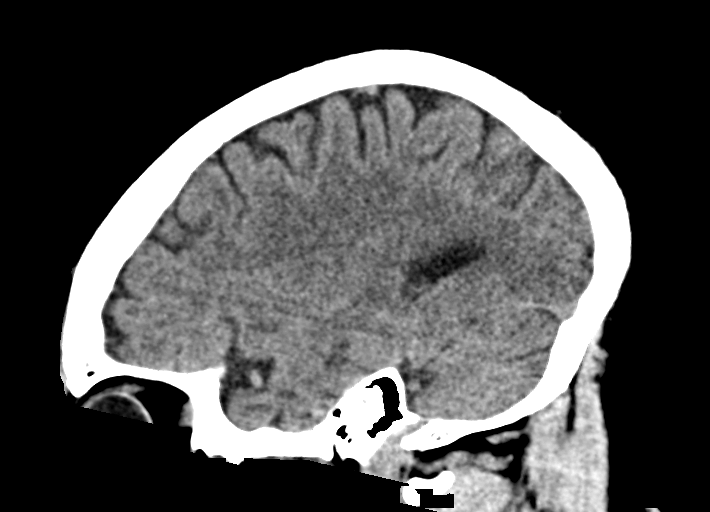
[im 28/56  brain]
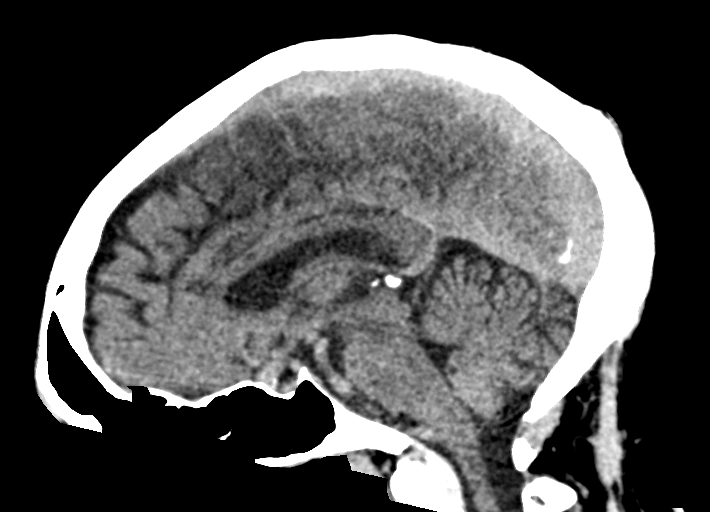
[im 37/56  brain]
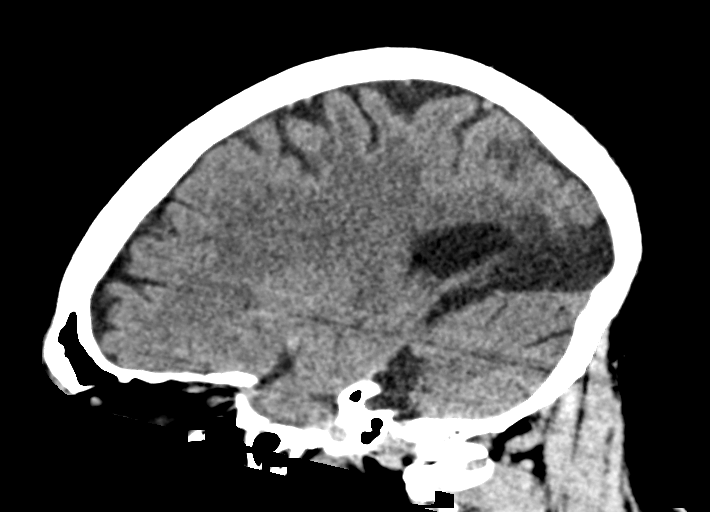

[15 of 47 positions shown; findings below may reference images not displayed]

FINDINGS: Brain: No evidence of acute infarction, hemorrhage, hydrocephalus,
extra-axial collection or mass lesion/mass effect. Encephalomalacia
of the left occipital lobe (series 2, image 19). Small lacunar
infarction of the left caudate head. Mild periventricular and deep
white matter hypodensity.

Vascular: No hyperdense vessel or unexpected calcification.

Skull: Normal. Negative for fracture or focal lesion.

Sinuses/Orbits: No acute finding.

Other: None.
IMPRESSION: 1. No acute intracranial pathology.
2. Encephalomalacia of the left occipital lobe, in keeping with
nonacute infarction. Small lacunar infarction of the left caudate
head.
3. Small-vessel white matter disease.

## 2022-02-20 ENCOUNTER — Other Ambulatory Visit (INDEPENDENT_AMBULATORY_CARE_PROVIDER_SITE_OTHER): Payer: Medicare Other

## 2022-02-20 DIAGNOSIS — E1165 Type 2 diabetes mellitus with hyperglycemia: Secondary | ICD-10-CM

## 2022-02-20 DIAGNOSIS — Z125 Encounter for screening for malignant neoplasm of prostate: Secondary | ICD-10-CM | POA: Diagnosis not present

## 2022-02-20 DIAGNOSIS — E538 Deficiency of other specified B group vitamins: Secondary | ICD-10-CM

## 2022-02-20 DIAGNOSIS — Z0000 Encounter for general adult medical examination without abnormal findings: Secondary | ICD-10-CM

## 2022-02-20 DIAGNOSIS — E559 Vitamin D deficiency, unspecified: Secondary | ICD-10-CM | POA: Diagnosis not present

## 2022-02-20 LAB — CBC WITH DIFFERENTIAL/PLATELET
Basophils Absolute: 0 10*3/uL (ref 0.0–0.1)
Basophils Relative: 0.7 % (ref 0.0–3.0)
Eosinophils Absolute: 0.1 10*3/uL (ref 0.0–0.7)
Eosinophils Relative: 1.3 % (ref 0.0–5.0)
HCT: 42.8 % (ref 39.0–52.0)
Hemoglobin: 14.2 g/dL (ref 13.0–17.0)
Lymphocytes Relative: 38.8 % (ref 12.0–46.0)
Lymphs Abs: 2.4 10*3/uL (ref 0.7–4.0)
MCHC: 33.1 g/dL (ref 30.0–36.0)
MCV: 87.1 fl (ref 78.0–100.0)
Monocytes Absolute: 0.6 10*3/uL (ref 0.1–1.0)
Monocytes Relative: 10.3 % (ref 3.0–12.0)
Neutro Abs: 3.1 10*3/uL (ref 1.4–7.7)
Neutrophils Relative %: 48.9 % (ref 43.0–77.0)
Platelets: 301 10*3/uL (ref 150.0–400.0)
RBC: 4.92 Mil/uL (ref 4.22–5.81)
RDW: 16.4 % — ABNORMAL HIGH (ref 11.5–15.5)
WBC: 6.3 10*3/uL (ref 4.0–10.5)

## 2022-02-20 LAB — BASIC METABOLIC PANEL
BUN: 19 mg/dL (ref 6–23)
CO2: 28 mEq/L (ref 19–32)
Calcium: 9.3 mg/dL (ref 8.4–10.5)
Chloride: 101 mEq/L (ref 96–112)
Creatinine, Ser: 1.97 mg/dL — ABNORMAL HIGH (ref 0.40–1.50)
GFR: 33.36 mL/min — ABNORMAL LOW (ref >60.00)
Glucose, Bld: 144 mg/dL — ABNORMAL HIGH (ref 70–99)
Potassium: 3.9 mEq/L (ref 3.5–5.1)
Sodium: 136 mEq/L (ref 135–145)

## 2022-02-20 LAB — URINALYSIS, ROUTINE W REFLEX MICROSCOPIC
Bilirubin Urine: NEGATIVE
Hgb urine dipstick: NEGATIVE
Ketones, ur: NEGATIVE
Leukocytes,Ua: NEGATIVE
Nitrite: NEGATIVE
RBC / HPF: NONE SEEN (ref 0–?)
Specific Gravity, Urine: 1.015 (ref 1.000–1.030)
Total Protein, Urine: NEGATIVE
Urine Glucose: NEGATIVE
Urobilinogen, UA: 0.2 (ref 0.0–1.0)
pH: 6 (ref 5.0–8.0)

## 2022-02-20 LAB — PSA: PSA: 1.01 ng/mL (ref 0.10–4.00)

## 2022-02-20 LAB — HEPATIC FUNCTION PANEL
ALT: 27 U/L (ref 0–53)
AST: 30 U/L (ref 0–37)
Albumin: 4.2 g/dL (ref 3.5–5.2)
Alkaline Phosphatase: 85 U/L (ref 39–117)
Bilirubin, Direct: 0.1 mg/dL (ref 0.0–0.3)
Total Bilirubin: 0.6 mg/dL (ref 0.2–1.2)
Total Protein: 7.1 g/dL (ref 6.0–8.3)

## 2022-02-20 LAB — VITAMIN B12: Vitamin B-12: 657 pg/mL (ref 211–911)

## 2022-02-20 LAB — LIPID PANEL
Cholesterol: 160 mg/dL (ref 0–200)
HDL: 40.4 mg/dL (ref >39.00)
NonHDL: 119.7
Total CHOL/HDL Ratio: 4
Triglycerides: 203 mg/dL — ABNORMAL HIGH (ref 0.0–149.0)
VLDL: 40.6 mg/dL — ABNORMAL HIGH (ref 0.0–40.0)

## 2022-02-20 LAB — VITAMIN D 25 HYDROXY (VIT D DEFICIENCY, FRACTURES): VITD: 57.58 ng/mL (ref 30.00–100.00)

## 2022-02-20 LAB — HEMOGLOBIN A1C: Hgb A1c MFr Bld: 6.8 % — ABNORMAL HIGH (ref 4.6–6.5)

## 2022-02-20 LAB — TSH: TSH: 1.87 u[IU]/mL (ref 0.35–5.50)

## 2022-02-20 LAB — MICROALBUMIN / CREATININE URINE RATIO
Creatinine,U: 158 mg/dL
Microalb Creat Ratio: 1.6 mg/g (ref 0.0–30.0)
Microalb, Ur: 2.5 mg/dL — ABNORMAL HIGH (ref 0.0–1.9)

## 2022-02-20 LAB — LDL CHOLESTEROL, DIRECT: Direct LDL: 91 mg/dL

## 2022-03-02 ENCOUNTER — Ambulatory Visit: Payer: Medicare Other | Admitting: Internal Medicine

## 2022-03-19 ENCOUNTER — Ambulatory Visit (INDEPENDENT_AMBULATORY_CARE_PROVIDER_SITE_OTHER): Payer: Medicare Other | Admitting: Internal Medicine

## 2022-03-19 ENCOUNTER — Encounter: Payer: Self-pay | Admitting: Internal Medicine

## 2022-03-19 VITALS — BP 110/60 | HR 61 | Temp 97.4°F | Ht 69.0 in | Wt 204.0 lb

## 2022-03-19 DIAGNOSIS — N1831 Chronic kidney disease, stage 3a: Secondary | ICD-10-CM | POA: Diagnosis not present

## 2022-03-19 DIAGNOSIS — I10 Essential (primary) hypertension: Secondary | ICD-10-CM

## 2022-03-19 DIAGNOSIS — E559 Vitamin D deficiency, unspecified: Secondary | ICD-10-CM

## 2022-03-19 DIAGNOSIS — E538 Deficiency of other specified B group vitamins: Secondary | ICD-10-CM | POA: Diagnosis not present

## 2022-03-19 DIAGNOSIS — E1165 Type 2 diabetes mellitus with hyperglycemia: Secondary | ICD-10-CM

## 2022-03-19 DIAGNOSIS — Z0001 Encounter for general adult medical examination with abnormal findings: Secondary | ICD-10-CM

## 2022-03-19 DIAGNOSIS — E7800 Pure hypercholesterolemia, unspecified: Secondary | ICD-10-CM | POA: Diagnosis not present

## 2022-03-19 MED ORDER — EZETIMIBE 10 MG PO TABS: 10.0000 mg | ORAL_TABLET | Freq: Every day | ORAL | 3 refills | Status: DC

## 2022-03-19 NOTE — Patient Instructions (Signed)
Please take all new medication as prescribed - the generic for zetia for cholesterol ? ?Please continue all other medications as before, and refills have been done if requested. ? ?Please have the pharmacy call with any other refills you may need. ? ?Please continue your efforts at being more active, low cholesterol diet, and weight control. ? ?You are otherwise up to date with prevention measures today. ? ?Please keep your appointments with your specialists as you may have planned ? ?Please make an Appointment to return in 6 months, or sooner if needed, also with Lab Appointment for testing done 3-5 days before at the [facility] (so this is for TWO appointments - please see the scheduling desk as you leave) ? ?Due to the ongoing Covid 19 pandemic, our lab now requires an appointment for any labs done at our office.  If you need labs done and do not have an appointment, please call our office ahead of time to schedule before presenting to the lab for your testing. ? ? ?
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
## 2022-03-25 DIAGNOSIS — E559 Vitamin D deficiency, unspecified: Secondary | ICD-10-CM | POA: Insufficient documentation

## 2022-03-25 NOTE — Assessment & Plan Note (Signed)

## 2022-03-25 NOTE — Addendum Note (Signed)
Addended by: Biagio Borg on: 03/25/2022 04:32 PM ? ? Modules accepted: Orders ? ?

## 2022-03-25 NOTE — Assessment & Plan Note (Signed)
Lab Results  ?Component Value Date  ? [id number] 657 02/20/2022  ? ?Stable, cont oral replacement - b12 1000 mcg qd ? ?

## 2022-03-25 NOTE — Assessment & Plan Note (Addendum)
BP Readings from Last 3 Encounters:  ?03/19/22 110/60  ?01/21/22 131/89  ?11/23/21 138/72  ?stable,  pt to continue medical treatment norvasc ? ?

## 2022-03-25 NOTE — Assessment & Plan Note (Signed)
Lab Results  ?Component Value Date  ? LDLCALC 105 (H) 11/16/2020  ? ?Uncontrolled, to add zetia 10 qd, pt to continue current statin lipitor ? ?

## 2022-03-25 NOTE — Assessment & Plan Note (Signed)
Last vitamin D Lab Results  Component Value Date   VD25OH 57.58 02/20/2022   Stable, cont oral replacement  

## 2022-03-25 NOTE — Assessment & Plan Note (Signed)
Lab Results  Component Value Date   CREATININE 1.97 (H) 02/20/2022   Stable overall, cont to avoid nephrotoxins  

## 2022-03-25 NOTE — Assessment & Plan Note (Signed)
Lab Results  ?Component Value Date  ? HGBA1C 6.8 (H) 02/20/2022  ? ?Stable, pt to continue current medical treatment glucotrol ? ?

## 2022-07-23 DIAGNOSIS — E1122 Type 2 diabetes mellitus with diabetic chronic kidney disease: Secondary | ICD-10-CM | POA: Diagnosis not present

## 2022-07-23 DIAGNOSIS — I129 Hypertensive chronic kidney disease with stage 1 through stage 4 chronic kidney disease, or unspecified chronic kidney disease: Secondary | ICD-10-CM | POA: Diagnosis not present

## 2022-07-23 DIAGNOSIS — N183 Chronic kidney disease, stage 3 unspecified: Secondary | ICD-10-CM | POA: Diagnosis not present

## 2022-07-23 DIAGNOSIS — R8290 Unspecified abnormal findings in urine: Secondary | ICD-10-CM | POA: Diagnosis not present

## 2022-07-30 DIAGNOSIS — Z9889 Other specified postprocedural states: Secondary | ICD-10-CM | POA: Diagnosis not present

## 2022-07-30 DIAGNOSIS — C641 Malignant neoplasm of right kidney, except renal pelvis: Secondary | ICD-10-CM | POA: Diagnosis not present

## 2022-07-31 DIAGNOSIS — E1122 Type 2 diabetes mellitus with diabetic chronic kidney disease: Secondary | ICD-10-CM | POA: Diagnosis not present

## 2022-07-31 DIAGNOSIS — R8281 Pyuria: Secondary | ICD-10-CM | POA: Diagnosis not present

## 2022-07-31 DIAGNOSIS — N1832 Chronic kidney disease, stage 3b: Secondary | ICD-10-CM | POA: Diagnosis not present

## 2022-07-31 DIAGNOSIS — N183 Chronic kidney disease, stage 3 unspecified: Secondary | ICD-10-CM | POA: Diagnosis not present

## 2022-07-31 DIAGNOSIS — D631 Anemia in chronic kidney disease: Secondary | ICD-10-CM | POA: Diagnosis not present

## 2022-07-31 DIAGNOSIS — E559 Vitamin D deficiency, unspecified: Secondary | ICD-10-CM | POA: Diagnosis not present

## 2022-07-31 DIAGNOSIS — C641 Malignant neoplasm of right kidney, except renal pelvis: Secondary | ICD-10-CM | POA: Diagnosis not present

## 2022-07-31 DIAGNOSIS — I129 Hypertensive chronic kidney disease with stage 1 through stage 4 chronic kidney disease, or unspecified chronic kidney disease: Secondary | ICD-10-CM | POA: Diagnosis not present

## 2022-07-31 DIAGNOSIS — M898X9 Other specified disorders of bone, unspecified site: Secondary | ICD-10-CM | POA: Diagnosis not present

## 2022-08-08 DIAGNOSIS — M1712 Unilateral primary osteoarthritis, left knee: Secondary | ICD-10-CM | POA: Diagnosis not present

## 2022-08-21 ENCOUNTER — Encounter: Payer: Self-pay | Admitting: Internal Medicine

## 2022-08-21 ENCOUNTER — Telehealth (INDEPENDENT_AMBULATORY_CARE_PROVIDER_SITE_OTHER): Payer: Medicare Other | Admitting: Internal Medicine

## 2022-08-21 ENCOUNTER — Other Ambulatory Visit: Payer: Self-pay | Admitting: Internal Medicine

## 2022-08-21 DIAGNOSIS — E1165 Type 2 diabetes mellitus with hyperglycemia: Secondary | ICD-10-CM

## 2022-08-21 DIAGNOSIS — N1831 Chronic kidney disease, stage 3a: Secondary | ICD-10-CM

## 2022-08-21 DIAGNOSIS — R059 Cough, unspecified: Secondary | ICD-10-CM

## 2022-08-21 DIAGNOSIS — J209 Acute bronchitis, unspecified: Secondary | ICD-10-CM

## 2022-08-21 DIAGNOSIS — R509 Fever, unspecified: Secondary | ICD-10-CM

## 2022-08-21 DIAGNOSIS — U071 COVID-19: Secondary | ICD-10-CM | POA: Insufficient documentation

## 2022-08-21 MED ORDER — NIRMATRELVIR/RITONAVIR (PAXLOVID) TABLET (RENAL DOSING): 2.0000 | ORAL_TABLET | Freq: Two times a day (BID) | ORAL | 0 refills | Status: AC

## 2022-08-21 MED ORDER — ALBUTEROL SULFATE HFA 108 (90 BASE) MCG/ACT IN AERS: 2.0000 | INHALATION_SPRAY | Freq: Four times a day (QID) | RESPIRATORY_TRACT | 1 refills | Status: DC | PRN

## 2022-08-21 MED ORDER — HYDROCODONE BIT-HOMATROP MBR 5-1.5 MG/5ML PO SOLN: 5.0000 mL | Freq: Four times a day (QID) | ORAL | 0 refills | Status: AC | PRN
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
## 2022-08-21 NOTE — Assessment & Plan Note (Signed)
.   Lab Results  Component Value Date   HGBA1C 6.8 (H) 02/20/2022   Stable, pt to continue current medical treatment glipizide er 5 mg qd

## 2022-08-21 NOTE — Assessment & Plan Note (Signed)
Lab Results  Component Value Date   CREATININE 1.97 (H) 02/20/2022   Stable overall, cont to avoid nephrotoxins

## 2022-08-21 NOTE — Assessment & Plan Note (Signed)
Pt high risk with hx of multiple comorbids including CKD gfr 32 - pt for paxlovid renal dose, cough med prn, and inhaler prn,  to f/u any worsening symptoms or concerns

## 2022-08-21 NOTE — Patient Instructions (Signed)
Please take all new medication as prescribed 

## 2022-09-19 ENCOUNTER — Encounter: Payer: Self-pay | Admitting: Internal Medicine

## 2022-09-19 ENCOUNTER — Ambulatory Visit (INDEPENDENT_AMBULATORY_CARE_PROVIDER_SITE_OTHER): Payer: Medicare Other | Admitting: Internal Medicine

## 2022-09-19 ENCOUNTER — Other Ambulatory Visit: Payer: Self-pay | Admitting: Internal Medicine

## 2022-09-19 VITALS — BP 122/70 | HR 64 | Temp 98.2°F | Ht 69.0 in | Wt 190.0 lb

## 2022-09-19 DIAGNOSIS — N1831 Chronic kidney disease, stage 3a: Secondary | ICD-10-CM

## 2022-09-19 DIAGNOSIS — E538 Deficiency of other specified B group vitamins: Secondary | ICD-10-CM | POA: Diagnosis not present

## 2022-09-19 DIAGNOSIS — I10 Essential (primary) hypertension: Secondary | ICD-10-CM | POA: Diagnosis not present

## 2022-09-19 DIAGNOSIS — E1165 Type 2 diabetes mellitus with hyperglycemia: Secondary | ICD-10-CM

## 2022-09-19 DIAGNOSIS — E7800 Pure hypercholesterolemia, unspecified: Secondary | ICD-10-CM | POA: Diagnosis not present

## 2022-09-19 DIAGNOSIS — Z23 Encounter for immunization: Secondary | ICD-10-CM | POA: Diagnosis not present

## 2022-09-19 DIAGNOSIS — Z125 Encounter for screening for malignant neoplasm of prostate: Secondary | ICD-10-CM

## 2022-09-19 DIAGNOSIS — E559 Vitamin D deficiency, unspecified: Secondary | ICD-10-CM

## 2022-09-19 LAB — LIPID PANEL
Cholesterol: 146 mg/dL (ref 0–200)
HDL: 39.4 mg/dL (ref >39.00)
NonHDL: 106.38
Total CHOL/HDL Ratio: 4
Triglycerides: 221 mg/dL — ABNORMAL HIGH (ref 0.0–149.0)
VLDL: 44.2 mg/dL — ABNORMAL HIGH (ref 0.0–40.0)

## 2022-09-19 LAB — HEMOGLOBIN A1C: Hgb A1c MFr Bld: 6.8 % — ABNORMAL HIGH (ref 4.6–6.5)

## 2022-09-19 LAB — BASIC METABOLIC PANEL
BUN: 21 mg/dL (ref 6–23)
CO2: 30 mEq/L (ref 19–32)
Calcium: 9.7 mg/dL (ref 8.4–10.5)
Chloride: 97 mEq/L (ref 96–112)
Creatinine, Ser: 2.02 mg/dL — ABNORMAL HIGH (ref 0.40–1.50)
GFR: 32.24 mL/min — ABNORMAL LOW (ref >60.00)
Glucose, Bld: 128 mg/dL — ABNORMAL HIGH (ref 70–99)
Potassium: 3.4 mEq/L — ABNORMAL LOW (ref 3.5–5.1)
Sodium: 134 mEq/L — ABNORMAL LOW (ref 135–145)

## 2022-09-19 LAB — HEPATIC FUNCTION PANEL
ALT: 37 U/L (ref 0–53)
AST: 37 U/L (ref 0–37)
Albumin: 4.1 g/dL (ref 3.5–5.2)
Alkaline Phosphatase: 81 U/L (ref 39–117)
Bilirubin, Direct: 0.2 mg/dL (ref 0.0–0.3)
Total Bilirubin: 0.8 mg/dL (ref 0.2–1.2)
Total Protein: 7.7 g/dL (ref 6.0–8.3)

## 2022-09-19 LAB — VITAMIN D 25 HYDROXY (VIT D DEFICIENCY, FRACTURES): VITD: 58.76 ng/mL (ref 30.00–100.00)

## 2022-09-19 LAB — LDL CHOLESTEROL, DIRECT: Direct LDL: 81 mg/dL

## 2022-09-19 NOTE — Assessment & Plan Note (Signed)
Lab Results  Component Value Date   HGBA1C 6.8 (H) 02/20/2022   Mild elevated,, pt to continue current medical treatment glucotrol xl 5 mg , for f/u lab today

## 2022-09-19 NOTE — Assessment & Plan Note (Addendum)
Lab Results  Component Value Date   LDLCALC 105 (H) 11/16/2020   Uncontrolled, goal ldl < 70, pt to continue current statin lipitor 80 mg and zetia 10 mg as declines change, also for lower chol diet

## 2022-09-19 NOTE — Assessment & Plan Note (Signed)
BP Readings from Last 3 Encounters:  09/19/22 122/70  03/19/22 110/60  01/21/22 131/89   Stable, pt to continue medical treatment norvasc 10 mg qd

## 2022-09-19 NOTE — Patient Instructions (Signed)
You had the flu shot today  Please continue all other medications as before, and refills have been done if requested.  Please have the pharmacy call with any other refills you may need.  Please continue your efforts at being more active, low cholesterol diet, and weight control.  You are otherwise up to date with prevention measures today.  Please keep your appointments with your specialists as you may have planned  Please go to the LAB at the blood drawing area for the tests to be done  You will be contacted by phone if any changes need to be made immediately.  Otherwise, you will receive a letter about your results with an explanation, but please check with MyChart first.  Please make an Appointment to return in 6 months, or sooner if needed, also with Lab Appointment for testing done 3-5 days before at the [facility] (so this is for TWO appointments - please see the scheduling desk as you leave)

## 2022-09-19 NOTE — Assessment & Plan Note (Signed)
Last vitamin D Lab Results  Component Value Date   VD25OH 57.58 02/20/2022   Stable, cont oral replacement

## 2022-09-19 NOTE — Assessment & Plan Note (Signed)
Lab Results  Component Value Date   CREATININE 1.97 (H) 02/20/2022   Stable overall, cont to avoid nephrotoxins, for f/u lab, continue renal f/u as planned
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
## 2022-09-20 ENCOUNTER — Telehealth: Payer: Self-pay | Admitting: Internal Medicine

## 2022-09-20 NOTE — Telephone Encounter (Signed)
Patient called back about message below:  Calvin Wilcox, [facility]  09/20/2022  8:51 AM EDT Back to Top    Unable to leave message for call back regarding results   Biagio Borg, MD  09/19/2022  4:52 PM EDT     The test results show that your current treatment is OK, as the tests are stable, except the kidney function is slightly worsening, and now low enough to the point we should refer to Nephrology (kidney doctor).  I will do this, and hopefully you will hear soon.    Staff to please inform pt, I will do referral   Patient understands and will wait for a call from the nephrologist

## 2022-10-09 ENCOUNTER — Telehealth: Payer: Self-pay | Admitting: Internal Medicine

## 2022-10-09 NOTE — Telephone Encounter (Signed)
Please refill as per office routine med refill policy (all routine meds to be refilled for 3 mo or monthly (per pt preference) up to one year from last visit, then month to month grace period for 3 mo, then further med refills will have to be denied) ? ?

## 2022-10-18 NOTE — Telephone Encounter (Addendum)
Patient called to follow up on this, last visit was 09/19/22, next 03/21/23. [facility] on [address]

## 2022-10-18 NOTE — Telephone Encounter (Signed)
Patients spouse called and I informed her, (after getting clarification from pharmacy) that the refill was refused because it was too soon for a refill. Patient picked up Rx on 09/07/22.

## 2022-10-27 ENCOUNTER — Other Ambulatory Visit: Payer: Self-pay | Admitting: Internal Medicine

## 2022-10-28 NOTE — Telephone Encounter (Signed)
Please refill as per office routine med refill policy (all routine meds to be refilled for 3 mo or monthly (per pt preference) up to one year from last visit, then month to month grace period for 3 mo, then further med refills will have to be denied) ? ?

## 2022-11-28 ENCOUNTER — Other Ambulatory Visit: Payer: Self-pay | Admitting: Internal Medicine

## 2022-11-28 NOTE — Telephone Encounter (Signed)
Please refill as per office routine med refill policy (all routine meds to be refilled for 3 mo or monthly (per pt preference) up to one year from last visit, then month to month grace period for 3 mo, then further med refills will have to be denied) ? ?

## 2022-12-05 DIAGNOSIS — M898X5 Other specified disorders of bone, thigh: Secondary | ICD-10-CM | POA: Diagnosis not present

## 2022-12-12 ENCOUNTER — Other Ambulatory Visit: Payer: Self-pay

## 2022-12-12 MED ORDER — AMLODIPINE BESYLATE 10 MG PO TABS: ORAL_TABLET | ORAL | 2 refills | Status: DC

## 2023-01-09 ENCOUNTER — Other Ambulatory Visit: Payer: Self-pay

## 2023-01-09 MED ORDER — ATORVASTATIN CALCIUM 80 MG PO TABS: 80.0000 mg | ORAL_TABLET | Freq: Every day | ORAL | 3 refills | Status: DC

## 2023-01-30 DIAGNOSIS — N183 Chronic kidney disease, stage 3 unspecified: Secondary | ICD-10-CM | POA: Diagnosis not present

## 2023-01-30 DIAGNOSIS — E1122 Type 2 diabetes mellitus with diabetic chronic kidney disease: Secondary | ICD-10-CM | POA: Diagnosis not present

## 2023-01-30 DIAGNOSIS — I129 Hypertensive chronic kidney disease with stage 1 through stage 4 chronic kidney disease, or unspecified chronic kidney disease: Secondary | ICD-10-CM | POA: Diagnosis not present

## 2023-02-04 DIAGNOSIS — D631 Anemia in chronic kidney disease: Secondary | ICD-10-CM | POA: Diagnosis not present

## 2023-02-04 DIAGNOSIS — I129 Hypertensive chronic kidney disease with stage 1 through stage 4 chronic kidney disease, or unspecified chronic kidney disease: Secondary | ICD-10-CM | POA: Diagnosis not present

## 2023-02-04 DIAGNOSIS — E1122 Type 2 diabetes mellitus with diabetic chronic kidney disease: Secondary | ICD-10-CM | POA: Diagnosis not present

## 2023-02-04 DIAGNOSIS — N183 Chronic kidney disease, stage 3 unspecified: Secondary | ICD-10-CM | POA: Diagnosis not present

## 2023-02-04 DIAGNOSIS — N1832 Chronic kidney disease, stage 3b: Secondary | ICD-10-CM | POA: Diagnosis not present

## 2023-02-04 DIAGNOSIS — M898X9 Other specified disorders of bone, unspecified site: Secondary | ICD-10-CM | POA: Diagnosis not present

## 2023-02-04 DIAGNOSIS — E559 Vitamin D deficiency, unspecified: Secondary | ICD-10-CM | POA: Diagnosis not present

## 2023-02-07 ENCOUNTER — Other Ambulatory Visit: Payer: Self-pay | Admitting: Internal Medicine

## 2023-02-07 NOTE — Telephone Encounter (Signed)
Please refill as per office routine med refill policy (all routine meds to be refilled for 3 mo or monthly (per pt preference) up to one year from last visit, then month to month grace period for 3 mo, then further med refills will have to be denied) ? ?

## 2023-02-12 ENCOUNTER — Telehealth: Payer: Self-pay | Admitting: Internal Medicine

## 2023-02-12 NOTE — Telephone Encounter (Signed)
Patient called requesting a refill on his medication omeprazole (PRILOSEC) 20 MG capsule. Patient is requesting this refill be sent to his pharmacy on file [facility], [place] - [address] AT [place].   Best callback number is [phone].

## 2023-02-12 NOTE — Telephone Encounter (Signed)
Patient states that pharmacy said they did not receive - please resend again.

## 2023-02-12 NOTE — Telephone Encounter (Signed)
Patient and wife informed of refill sent to pharmacy on 02/07/23

## 2023-02-13 MED ORDER — OMEPRAZOLE 20 MG PO CPDR: 20.0000 mg | DELAYED_RELEASE_CAPSULE | Freq: Two times a day (BID) | ORAL | 1 refills | Status: DC

## 2023-02-13 NOTE — Telephone Encounter (Signed)
Per chart rx was sent on 02/07/23. [facility]ent [facility] to [facility].Marland KitchenJohny Wilcox

## 2023-02-13 NOTE — Addendum Note (Signed)
Addended by: Earnstine Regal on: 02/13/2023 09:35 AM   Modules accepted: Orders

## 2023-02-25 DIAGNOSIS — M1711 Unilateral primary osteoarthritis, right knee: Secondary | ICD-10-CM | POA: Diagnosis not present

## 2023-03-13 ENCOUNTER — Other Ambulatory Visit (INDEPENDENT_AMBULATORY_CARE_PROVIDER_SITE_OTHER): Payer: Medicare Other

## 2023-03-13 DIAGNOSIS — E538 Deficiency of other specified B group vitamins: Secondary | ICD-10-CM

## 2023-03-13 DIAGNOSIS — E559 Vitamin D deficiency, unspecified: Secondary | ICD-10-CM

## 2023-03-13 DIAGNOSIS — Z125 Encounter for screening for malignant neoplasm of prostate: Secondary | ICD-10-CM

## 2023-03-13 DIAGNOSIS — E1165 Type 2 diabetes mellitus with hyperglycemia: Secondary | ICD-10-CM | POA: Diagnosis not present

## 2023-03-13 LAB — URINALYSIS, ROUTINE W REFLEX MICROSCOPIC
Bilirubin Urine: NEGATIVE
Hgb urine dipstick: NEGATIVE
Nitrite: NEGATIVE
Specific Gravity, Urine: 1.025 (ref 1.000–1.030)
Total Protein, Urine: NEGATIVE
Urine Glucose: NEGATIVE
Urobilinogen, UA: 0.2 (ref 0.0–1.0)
pH: 6 (ref 5.0–8.0)

## 2023-03-13 LAB — LIPID PANEL
Cholesterol: 172 mg/dL (ref 0–200)
HDL: 41.9 mg/dL (ref >39.00)
LDL Cholesterol: 104 mg/dL — ABNORMAL HIGH (ref 0–99)
NonHDL: 129.63
Total CHOL/HDL Ratio: 4
Triglycerides: 128 mg/dL (ref 0.0–149.0)
VLDL: 25.6 mg/dL (ref 0.0–40.0)

## 2023-03-13 LAB — CBC WITH DIFFERENTIAL/PLATELET
Basophils Absolute: 0.1 10*3/uL (ref 0.0–0.1)
Basophils Relative: 0.9 % (ref 0.0–3.0)
Eosinophils Absolute: 0.1 10*3/uL (ref 0.0–0.7)
Eosinophils Relative: 1.4 % (ref 0.0–5.0)
HCT: 41.5 % (ref 39.0–52.0)
Hemoglobin: 13.9 g/dL (ref 13.0–17.0)
Lymphocytes Relative: 34.8 % (ref 12.0–46.0)
Lymphs Abs: 2 10*3/uL (ref 0.7–4.0)
MCHC: 33.4 g/dL (ref 30.0–36.0)
MCV: 86.7 fl (ref 78.0–100.0)
Monocytes Absolute: 0.7 10*3/uL (ref 0.1–1.0)
Monocytes Relative: 11.7 % (ref 3.0–12.0)
Neutro Abs: 3 10*3/uL (ref 1.4–7.7)
Neutrophils Relative %: 51.2 % (ref 43.0–77.0)
Platelets: 295 10*3/uL (ref 150.0–400.0)
RBC: 4.79 Mil/uL (ref 4.22–5.81)
RDW: 16.3 % — ABNORMAL HIGH (ref 11.5–15.5)
WBC: 5.9 10*3/uL (ref 4.0–10.5)

## 2023-03-13 LAB — MICROALBUMIN / CREATININE URINE RATIO
Creatinine,U: 223.9 mg/dL
Microalb Creat Ratio: 1.8 mg/g (ref 0.0–30.0)
Microalb, Ur: 3.9 mg/dL — ABNORMAL HIGH (ref 0.0–1.9)

## 2023-03-13 LAB — BASIC METABOLIC PANEL
BUN: 16 mg/dL (ref 6–23)
CO2: 30 mEq/L (ref 19–32)
Calcium: 9.1 mg/dL (ref 8.4–10.5)
Chloride: 102 mEq/L (ref 96–112)
Creatinine, Ser: 1.99 mg/dL — ABNORMAL HIGH (ref 0.40–1.50)
GFR: 32.71 mL/min — ABNORMAL LOW (ref >60.00)
Glucose, Bld: 72 mg/dL (ref 70–99)
Potassium: 4 mEq/L (ref 3.5–5.1)
Sodium: 140 mEq/L (ref 135–145)

## 2023-03-13 LAB — HEMOGLOBIN A1C: Hgb A1c MFr Bld: 6.8 % — ABNORMAL HIGH (ref 4.6–6.5)

## 2023-03-13 LAB — HEPATIC FUNCTION PANEL
ALT: 24 U/L (ref 0–53)
AST: 30 U/L (ref 0–37)
Albumin: 4.3 g/dL (ref 3.5–5.2)
Alkaline Phosphatase: 83 U/L (ref 39–117)
Bilirubin, Direct: 0.1 mg/dL (ref 0.0–0.3)
Total Bilirubin: 0.7 mg/dL (ref 0.2–1.2)
Total Protein: 7 g/dL (ref 6.0–8.3)

## 2023-03-13 LAB — TSH: TSH: 1.5 u[IU]/mL (ref 0.35–5.50)

## 2023-03-13 LAB — VITAMIN B12: Vitamin B-12: 745 pg/mL (ref 211–911)

## 2023-03-13 LAB — PSA: PSA: 0.93 ng/mL (ref 0.10–4.00)

## 2023-03-13 LAB — VITAMIN D 25 HYDROXY (VIT D DEFICIENCY, FRACTURES): VITD: 56.09 ng/mL (ref 30.00–100.00)

## 2023-03-13 NOTE — Progress Notes (Signed)
The test results show that your current treatment is OK, as the tests are stable.  Please continue the same plan.  There is no other need for change of treatment or further evaluation based on these results, at this time.  thanks 

## 2023-03-21 ENCOUNTER — Ambulatory Visit: Payer: Medicare Other | Admitting: Internal Medicine

## 2023-03-25 ENCOUNTER — Ambulatory Visit (INDEPENDENT_AMBULATORY_CARE_PROVIDER_SITE_OTHER): Payer: Medicare Other | Admitting: Internal Medicine

## 2023-03-25 VITALS — BP 144/86 | HR 64 | Temp 98.6°F | Ht 69.0 in | Wt 196.0 lb

## 2023-03-25 DIAGNOSIS — Z0001 Encounter for general adult medical examination with abnormal findings: Secondary | ICD-10-CM

## 2023-03-25 DIAGNOSIS — I10 Essential (primary) hypertension: Secondary | ICD-10-CM

## 2023-03-25 DIAGNOSIS — E559 Vitamin D deficiency, unspecified: Secondary | ICD-10-CM

## 2023-03-25 DIAGNOSIS — E1165 Type 2 diabetes mellitus with hyperglycemia: Secondary | ICD-10-CM

## 2023-03-25 DIAGNOSIS — E7800 Pure hypercholesterolemia, unspecified: Secondary | ICD-10-CM

## 2023-03-25 DIAGNOSIS — M1712 Unilateral primary osteoarthritis, left knee: Secondary | ICD-10-CM | POA: Diagnosis not present

## 2023-03-25 DIAGNOSIS — E538 Deficiency of other specified B group vitamins: Secondary | ICD-10-CM

## 2023-03-25 MED ORDER — EZETIMIBE 10 MG PO TABS: 10.0000 mg | ORAL_TABLET | Freq: Every day | ORAL | 3 refills | Status: DC
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
## 2023-03-25 NOTE — Patient Instructions (Addendum)
Please have your Shingrix (shingles) shots done at your local pharmacy.  [email or web address] is the Mychart User ID, and you made the new Password today  Please make sure to see orthopedic if the left knee gets worse  Please continue all other medications as before, and refills have been done if requested.  Please have the pharmacy call with any other refills you may need.  Please continue your efforts at being more active, low cholesterol diet, and weight control.  You are otherwise up to date with prevention measures today.  Please keep your appointments with your specialists as you may have planned  Please make an Appointment to return in 6 months, or sooner if needed

## 2023-03-26 ENCOUNTER — Encounter: Payer: Self-pay | Admitting: Internal Medicine

## 2023-03-26 DIAGNOSIS — M1712 Unilateral primary osteoarthritis, left knee: Secondary | ICD-10-CM | POA: Insufficient documentation

## 2023-03-26 NOTE — Assessment & Plan Note (Signed)
Lab Results  Component Value Date   LDLCALC 104 (H) 03/13/2023   Uncontrolled,  pt to continue current statin lipitor 80 mg and zetia 10 mg qd, and lower chol diet, declines repatha for now

## 2023-03-26 NOTE — Assessment & Plan Note (Signed)
Lab Results  Component Value Date   HGBA1C 6.8 (H) 03/13/2023   Stable, pt to continue current medical treatment glipizide ER 5 mg qd

## 2023-03-26 NOTE — Assessment & Plan Note (Addendum)
BP Readings from Last 3 Encounters:  03/25/23 (!) 144/86  09/19/22 122/70  03/19/22 110/60   Uncontrolled today, pt to continue medical treatment norvasc 10 qd, maxide 25 - 1 qd as declines change today, states BP better controlled in past and will continue to follow at home

## 2023-03-26 NOTE — Assessment & Plan Note (Signed)
With mild increased symptoms uncontrolled, for otc topical voltaren gel prn, also f/u with ortho Delbert Harness as he has been considering and states will call himself

## 2023-03-26 NOTE — Assessment & Plan Note (Signed)
Lab Results  Component Value Date   VITAMINB12 745 03/13/2023   Stable, cont oral replacement - b12 1000 mcg qd

## 2023-03-26 NOTE — Assessment & Plan Note (Signed)
Last vitamin D Lab Results  Component Value Date   VD25OH 56.09 03/13/2023   Stable, cont oral replacement

## 2023-03-26 NOTE — Assessment & Plan Note (Signed)
Age and sex appropriate education and counseling updated with regular exercise and diet Referrals for preventative services - has appt to see optho soon Immunizations addressed - declines covid booster, for shingrix at pharmacy Smoking counseling  - none needed Evidence for depression or other mood disorder - none significant Most recent labs reviewed. I have personally reviewed and have noted: 1) the patient's medical and social history 2) The patient's current medications and supplements 3) The patient's height, weight, and BMI have been recorded in the chart

## 2023-05-24 DIAGNOSIS — M1712 Unilateral primary osteoarthritis, left knee: Secondary | ICD-10-CM | POA: Diagnosis not present

## 2023-06-24 ENCOUNTER — Telehealth: Payer: Self-pay | Admitting: Internal Medicine

## 2023-06-24 MED ORDER — GLIPIZIDE ER 5 MG PO TB24: ORAL_TABLET | ORAL | 1 refills | Status: DC

## 2023-06-24 NOTE — Telephone Encounter (Signed)
 [facility] to [facility].../lmb 

## 2023-06-24 NOTE — Telephone Encounter (Signed)
Prescription Request  06/24/2023  LOV: 03/25/2023  What is the name of the medication or equipment?  glipiZIDE (GLUCOTROL XL) 5 MG 24 hr tablet   Have you contacted your pharmacy to request a refill? No   Which pharmacy would you like this sent to?  [id number] - [place], [place] - [address] AT [address] [place] [address] Phone: [phone] Fax: [phone]    Patient notified that their request is being sent to the clinical staff for review and that they should receive a response within 2 business days.   Please advise at Mobile [phone] (mobile)

## 2023-07-17 DIAGNOSIS — M1712 Unilateral primary osteoarthritis, left knee: Secondary | ICD-10-CM | POA: Diagnosis not present

## 2023-07-18 ENCOUNTER — Telehealth: Payer: Self-pay | Admitting: Internal Medicine

## 2023-07-18 NOTE — Telephone Encounter (Signed)
Surgical clearance received from [facility] & placed in provider's box up front. Completed form and recent office notes should be faxed to [phone].

## 2023-07-18 NOTE — Telephone Encounter (Signed)
Faxed back to [employer] @ [phone]...Raechel Chute

## 2023-07-18 NOTE — Telephone Encounter (Signed)
Place in MD inbox to complete...Calvin Wilcox

## 2023-08-05 DIAGNOSIS — N1832 Chronic kidney disease, stage 3b: Secondary | ICD-10-CM | POA: Diagnosis not present

## 2023-08-05 DIAGNOSIS — C641 Malignant neoplasm of right kidney, except renal pelvis: Secondary | ICD-10-CM | POA: Diagnosis not present

## 2023-08-05 DIAGNOSIS — R935 Abnormal findings on diagnostic imaging of other abdominal regions, including retroperitoneum: Secondary | ICD-10-CM | POA: Diagnosis not present

## 2023-08-05 DIAGNOSIS — Z85528 Personal history of other malignant neoplasm of kidney: Secondary | ICD-10-CM | POA: Diagnosis not present

## 2023-08-07 DIAGNOSIS — E559 Vitamin D deficiency, unspecified: Secondary | ICD-10-CM | POA: Diagnosis not present

## 2023-08-07 DIAGNOSIS — D631 Anemia in chronic kidney disease: Secondary | ICD-10-CM | POA: Diagnosis not present

## 2023-08-07 DIAGNOSIS — E1122 Type 2 diabetes mellitus with diabetic chronic kidney disease: Secondary | ICD-10-CM | POA: Diagnosis not present

## 2023-08-07 DIAGNOSIS — Z7984 Long term (current) use of oral hypoglycemic drugs: Secondary | ICD-10-CM | POA: Diagnosis not present

## 2023-08-07 DIAGNOSIS — M898X9 Other specified disorders of bone, unspecified site: Secondary | ICD-10-CM | POA: Diagnosis not present

## 2023-08-07 DIAGNOSIS — I129 Hypertensive chronic kidney disease with stage 1 through stage 4 chronic kidney disease, or unspecified chronic kidney disease: Secondary | ICD-10-CM | POA: Diagnosis not present

## 2023-08-07 DIAGNOSIS — N1832 Chronic kidney disease, stage 3b: Secondary | ICD-10-CM | POA: Diagnosis not present

## 2023-08-08 DIAGNOSIS — C641 Malignant neoplasm of right kidney, except renal pelvis: Secondary | ICD-10-CM | POA: Diagnosis not present

## 2023-08-22 ENCOUNTER — Other Ambulatory Visit: Payer: Self-pay

## 2023-08-22 ENCOUNTER — Telehealth: Payer: Self-pay | Admitting: Internal Medicine

## 2023-08-22 MED ORDER — OMEPRAZOLE 20 MG PO CPDR: 20.0000 mg | DELAYED_RELEASE_CAPSULE | Freq: Two times a day (BID) | ORAL | 1 refills | Status: DC

## 2023-08-22 NOTE — Telephone Encounter (Signed)
Refill Sent. 

## 2023-08-22 NOTE — Telephone Encounter (Signed)
Prescription Request  08/22/2023  LOV: 03/25/2023  What is the name of the medication or equipment? omeprazole (PRILOSEC) 20 MG capsule   Have you contacted your pharmacy to request a refill? Yes   Which pharmacy would you like this sent to?  [id number] - [place],  - [address] [place] [address] Phone: [phone] Fax: [phone]    Patient notified that their request is being sent to the clinical staff for review and that they should receive a response within 2 business days.   Please advise at [facility] [phone]

## 2023-09-13 ENCOUNTER — Other Ambulatory Visit: Payer: Self-pay

## 2023-09-13 ENCOUNTER — Telehealth: Payer: Self-pay | Admitting: Internal Medicine

## 2023-09-13 MED ORDER — AMLODIPINE BESYLATE 10 MG PO TABS: ORAL_TABLET | ORAL | 3 refills | Status: DC

## 2023-09-13 NOTE — Telephone Encounter (Signed)
Next OV is 09/24/2023.   Prescription Request  09/13/2023  LOV: 03/25/2023  What is the name of the medication or equipment? amLODipine (NORVASC) 10 MG tablet   Have you contacted your pharmacy to request a refill? Yes   Which pharmacy would you like this sent to?  [facility] #[id number] - [place], [place] - [address]  [place] [address] Phone: [phone] Fax: [phone]    Patient notified that their request is being sent to the clinical staff for review and that they should receive a response within 2 business days.   Please advise at Mobile [phone] (mobile)

## 2023-09-13 NOTE — Telephone Encounter (Signed)
Refill sent.

## 2023-09-24 ENCOUNTER — Ambulatory Visit (INDEPENDENT_AMBULATORY_CARE_PROVIDER_SITE_OTHER): Payer: Medicare Other

## 2023-09-24 ENCOUNTER — Ambulatory Visit (INDEPENDENT_AMBULATORY_CARE_PROVIDER_SITE_OTHER): Payer: Medicare Other | Admitting: Internal Medicine

## 2023-09-24 ENCOUNTER — Encounter: Payer: Self-pay | Admitting: Internal Medicine

## 2023-09-24 VITALS — BP 120/78 | HR 55 | Temp 97.8°F | Ht 69.0 in | Wt 192.0 lb

## 2023-09-24 DIAGNOSIS — E1165 Type 2 diabetes mellitus with hyperglycemia: Secondary | ICD-10-CM

## 2023-09-24 DIAGNOSIS — E611 Iron deficiency: Secondary | ICD-10-CM

## 2023-09-24 DIAGNOSIS — R059 Cough, unspecified: Secondary | ICD-10-CM | POA: Diagnosis not present

## 2023-09-24 DIAGNOSIS — E538 Deficiency of other specified B group vitamins: Secondary | ICD-10-CM | POA: Diagnosis not present

## 2023-09-24 DIAGNOSIS — J209 Acute bronchitis, unspecified: Secondary | ICD-10-CM

## 2023-09-24 DIAGNOSIS — I10 Essential (primary) hypertension: Secondary | ICD-10-CM | POA: Diagnosis not present

## 2023-09-24 DIAGNOSIS — E7800 Pure hypercholesterolemia, unspecified: Secondary | ICD-10-CM | POA: Diagnosis not present

## 2023-09-24 DIAGNOSIS — E559 Vitamin D deficiency, unspecified: Secondary | ICD-10-CM

## 2023-09-24 DIAGNOSIS — R509 Fever, unspecified: Secondary | ICD-10-CM

## 2023-09-24 DIAGNOSIS — R9431 Abnormal electrocardiogram [ECG] [EKG]: Secondary | ICD-10-CM

## 2023-09-24 DIAGNOSIS — N1831 Chronic kidney disease, stage 3a: Secondary | ICD-10-CM | POA: Diagnosis not present

## 2023-09-24 DIAGNOSIS — Z0000 Encounter for general adult medical examination without abnormal findings: Secondary | ICD-10-CM

## 2023-09-24 LAB — CBC WITH DIFFERENTIAL/PLATELET
Basophils Absolute: 0.1 10*3/uL (ref 0.0–0.1)
Basophils Relative: 1 % (ref 0.0–3.0)
Eosinophils Absolute: 0.1 10*3/uL (ref 0.0–0.7)
Eosinophils Relative: 1.9 % (ref 0.0–5.0)
HCT: 43.3 % (ref 39.0–52.0)
Hemoglobin: 14.2 g/dL (ref 13.0–17.0)
Lymphocytes Relative: 32.5 % (ref 12.0–46.0)
Lymphs Abs: 2.4 10*3/uL (ref 0.7–4.0)
MCHC: 32.8 g/dL (ref 30.0–36.0)
MCV: 87.3 fL (ref 78.0–100.0)
Monocytes Absolute: 0.8 10*3/uL (ref 0.1–1.0)
Monocytes Relative: 10.5 % (ref 3.0–12.0)
Neutro Abs: 4.1 10*3/uL (ref 1.4–7.7)
Neutrophils Relative %: 54.1 % (ref 43.0–77.0)
Platelets: 329 10*3/uL (ref 150.0–400.0)
RBC: 4.95 Mil/uL (ref 4.22–5.81)
RDW: 16.2 % — ABNORMAL HIGH (ref 11.5–15.5)
WBC: 7.5 10*3/uL (ref 4.0–10.5)

## 2023-09-24 LAB — BASIC METABOLIC PANEL
BUN: 18 mg/dL (ref 6–23)
CO2: 30 meq/L (ref 19–32)
Calcium: 9.4 mg/dL (ref 8.4–10.5)
Chloride: 100 meq/L (ref 96–112)
Creatinine, Ser: 2.13 mg/dL — ABNORMAL HIGH (ref 0.40–1.50)
GFR: 30.04 mL/min — ABNORMAL LOW (ref >60.00)
Glucose, Bld: 120 mg/dL — ABNORMAL HIGH (ref 70–99)
Potassium: 3.7 meq/L (ref 3.5–5.1)
Sodium: 138 meq/L (ref 135–145)

## 2023-09-24 LAB — HEPATIC FUNCTION PANEL
ALT: 21 U/L (ref 0–53)
AST: 25 U/L (ref 0–37)
Albumin: 4.2 g/dL (ref 3.5–5.2)
Alkaline Phosphatase: 72 U/L (ref 39–117)
Bilirubin, Direct: 0.1 mg/dL (ref 0.0–0.3)
Total Bilirubin: 0.8 mg/dL (ref 0.2–1.2)
Total Protein: 7.6 g/dL (ref 6.0–8.3)

## 2023-09-24 LAB — LIPID PANEL
Cholesterol: 168 mg/dL (ref 0–200)
HDL: 37.7 mg/dL — ABNORMAL LOW (ref >39.00)
LDL Cholesterol: 94 mg/dL (ref 0–99)
NonHDL: 130.47
Total CHOL/HDL Ratio: 4
Triglycerides: 184 mg/dL — ABNORMAL HIGH (ref 0.0–149.0)
VLDL: 36.8 mg/dL (ref 0.0–40.0)

## 2023-09-24 LAB — VITAMIN D 25 HYDROXY (VIT D DEFICIENCY, FRACTURES): VITD: 52.38 ng/mL (ref 30.00–100.00)

## 2023-09-24 LAB — IBC PANEL
Iron: 65 ug/dL (ref 42–165)
Saturation Ratios: 22.1 % (ref 20.0–50.0)
TIBC: 294 ug/dL (ref 250.0–450.0)
Transferrin: 210 mg/dL — ABNORMAL LOW (ref 212.0–360.0)

## 2023-09-24 LAB — VITAMIN B12: Vitamin B-12: 1153 pg/mL — ABNORMAL HIGH (ref 211–911)

## 2023-09-24 LAB — FERRITIN: Ferritin: 226.6 ng/mL (ref 22.0–322.0)

## 2023-09-24 LAB — HEMOGLOBIN A1C: Hgb A1c MFr Bld: 6.7 % — ABNORMAL HIGH (ref 4.6–6.5)

## 2023-09-24 MED ORDER — TRIAMTERENE-HCTZ 37.5-25 MG PO TABS: 1.0000 | ORAL_TABLET | Freq: Every day | ORAL | 3 refills | Status: DC

## 2023-09-24 MED ORDER — ATORVASTATIN CALCIUM 80 MG PO TABS: 80.0000 mg | ORAL_TABLET | Freq: Every day | ORAL | 3 refills | Status: DC

## 2023-09-24 MED ORDER — AMLODIPINE BESYLATE 10 MG PO TABS: ORAL_TABLET | ORAL | 3 refills | Status: DC

## 2023-09-24 MED ORDER — GLIPIZIDE ER 5 MG PO TB24: ORAL_TABLET | ORAL | 3 refills | Status: DC

## 2023-09-24 MED ORDER — OMEPRAZOLE 20 MG PO CPDR: 20.0000 mg | DELAYED_RELEASE_CAPSULE | Freq: Two times a day (BID) | ORAL | 3 refills | Status: DC

## 2023-09-24 MED ORDER — EZETIMIBE 10 MG PO TABS: 10.0000 mg | ORAL_TABLET | Freq: Every day | ORAL | 3 refills | Status: DC

## 2023-09-24 MED ORDER — ALBUTEROL SULFATE HFA 108 (90 BASE) MCG/ACT IN AERS
2.0000 | INHALATION_SPRAY | Freq: Four times a day (QID) | RESPIRATORY_TRACT | 1 refills | Status: DC | PRN
Start: 2023-09-24 — End: 2024-03-24

## 2023-09-24 MED ORDER — TAMSULOSIN HCL 0.4 MG PO CAPS: ORAL_CAPSULE | ORAL | 3 refills | Status: DC
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
## 2023-09-24 NOTE — Progress Notes (Signed)
The test results show that your current treatment is OK, as the tests are stable.  Please continue the same plan.  There is no other need for change of treatment or further evaluation based on these results, at this time.  thanks 

## 2023-09-24 NOTE — Patient Instructions (Addendum)
Mr. Pinard , Thank you for taking time to come for your Medicare Wellness Visit. I appreciate your ongoing commitment to your health goals. Please review the following plan we discussed and let me know if I can assist you in the future.   Referrals/Orders/Follow-Ups/Clinician Recommendations: No  This is a list of the screening recommended for you and due dates:  Health Maintenance  Topic Date Due   Zoster (Shingles) Vaccine (1 of 2) Never done   Hemoglobin A1C  09/12/2023   COVID-19 Vaccine (4 - 2023-24 season) 10/10/2023*   Flu Shot  03/09/2024*   Eye exam for diabetics  03/10/2024*   Yearly kidney function blood test for diabetes  03/12/2024   Yearly kidney health urinalysis for diabetes  03/12/2024   Complete foot exam   09/23/2024   Medicare Annual Wellness Visit  09/23/2024   Colon Cancer Screening  12/21/2026   DTaP/Tdap/Td vaccine (5 - Td or Tdap) 11/18/2029   Pneumonia Vaccine  Completed   Hepatitis C Screening  Completed   HPV Vaccine  Aged Out  *Topic was postponed. The date shown is not the original due date.    Advanced directives: (Declined) Advance directive discussed with you today. Even though you declined this today, please call our office should you change your mind, and we can give you the proper paperwork for you to fill out.  Next Medicare Annual Wellness Visit scheduled for next year: No
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
## 2023-09-24 NOTE — Patient Instructions (Signed)
Please continue all other medications as before, and refills have been done if requested.  Please have the pharmacy call with any other refills you may need.  Please continue your efforts at being more active, low cholesterol diet, and weight control.  You are otherwise up to date with prevention measures today.  Please keep your appointments with your specialists as you may have planned  You will be contacted regarding the referral for: Cardiac CT score  Please go to the LAB at the blood drawing area for the tests to be done  You will be contacted by phone if any changes need to be made immediately.  Otherwise, you will receive a letter about your results with an explanation, but please check with MyChart first.  Please make an Appointment to return in 6 months, or sooner if needed

## 2023-09-26 ENCOUNTER — Encounter: Payer: Self-pay | Admitting: Internal Medicine

## 2023-09-26 NOTE — Assessment & Plan Note (Signed)
Lab Results  Component Value Date   HGBA1C 6.7 (H) 09/24/2023   Stable, pt to continue current medical treatment glucotrol xl 5 qd

## 2023-09-26 NOTE — Assessment & Plan Note (Signed)
Lab Results  Component Value Date   CREATININE 2.13 (H) 09/24/2023   Stable overall, cont to avoid nephrotoxins

## 2023-09-26 NOTE — Assessment & Plan Note (Signed)
Lab Results  Component Value Date   VITAMINB12 1,153 (H) 09/24/2023   Stable, cont oral replacement - b12 1000 mcg qd

## 2023-09-26 NOTE — Assessment & Plan Note (Signed)
BP Readings from Last 3 Encounters:  09/24/23 120/78  09/24/23 120/78  03/25/23 (!) 144/86   Stable, pt to continue medical treatment norvasc 10 every day, maxide 1 qd

## 2023-09-26 NOTE — Assessment & Plan Note (Signed)
No overt bleeding, for f/u iron

## 2023-09-26 NOTE — Assessment & Plan Note (Addendum)
Lab Results  Component Value Date   LDLCALC 94 09/24/2023   Uncontrolled, goal ldl < 70, pt to continue current statin lipitor 80, zetia 10 every day ad lower chol diet, declines other change today, also for card CT score

## 2023-09-26 NOTE — Assessment & Plan Note (Signed)
Last vitamin D Lab Results  Component Value Date   VD25OH 52.38 09/24/2023   Stable, cont oral replacement

## 2023-10-16 ENCOUNTER — Ambulatory Visit (HOSPITAL_BASED_OUTPATIENT_CLINIC_OR_DEPARTMENT_OTHER)
Admission: RE | Admit: 2023-10-16 | Discharge: 2023-10-16 | Disposition: A | Discharge status: Home / Self Care | Payer: Medicare Other | Source: Ambulatory Visit | Attending: Internal Medicine | Admitting: Internal Medicine

## 2023-10-16 DIAGNOSIS — E7800 Pure hypercholesterolemia, unspecified: Secondary | ICD-10-CM | POA: Insufficient documentation

## 2023-10-16 DIAGNOSIS — E1165 Type 2 diabetes mellitus with hyperglycemia: Secondary | ICD-10-CM | POA: Insufficient documentation

## 2023-10-16 DIAGNOSIS — I10 Essential (primary) hypertension: Secondary | ICD-10-CM | POA: Insufficient documentation

## 2023-10-16 DIAGNOSIS — R9431 Abnormal electrocardiogram [ECG] [EKG]: Secondary | ICD-10-CM | POA: Insufficient documentation

## 2023-10-16 DIAGNOSIS — I2510 Atherosclerotic heart disease of native coronary artery without angina pectoris: Secondary | ICD-10-CM | POA: Diagnosis not present

## 2023-10-21 ENCOUNTER — Other Ambulatory Visit: Payer: Self-pay | Admitting: Internal Medicine

## 2023-10-21 DIAGNOSIS — R931 Abnormal findings on diagnostic imaging of heart and coronary circulation: Secondary | ICD-10-CM

## 2023-10-21 MED ORDER — ASPIRIN 81 MG PO TBEC: 81.0000 mg | DELAYED_RELEASE_TABLET | Freq: Every day | ORAL | 99 refills | Status: AC

## 2023-10-31 DIAGNOSIS — G8929 Other chronic pain: Secondary | ICD-10-CM | POA: Diagnosis not present

## 2023-10-31 DIAGNOSIS — M25562 Pain in left knee: Secondary | ICD-10-CM | POA: Diagnosis not present

## 2023-11-12 NOTE — Progress Notes (Unsigned)
Cardiology Office Note:  .   Date:  11/13/2023 ID:  Lita Mains, DOB 21-Dec-1950, MRN [id number] PCP: Corwin Levins, MD [facility] Providers Cardiologist:  None   Patient Profile: .      PMH Elevated coronary artery calcium score CT calcium score 10/16/23 CAC score 1009 (80th percentile) LM 14, LAD 954, LCx 2, RCA 39 Hyperlipidemia Hypertension Long Q-T syndrome Paranoid schizophrenia Type 2 diabetes mellitus CKD Stage 3 Former tobacco abuse Quit 10-15 years        History of Present Illness: .   Abu Slingluff is a pleasant 72 y.o. male  who is here today for new patient consult for elevated coronary artery calcium score on CT 10/16/2023. He was advised to start aspirin 81 mg daily. His wife reports the CT was ordered due to frequent chest and leg cramps. He reports active lifestyle with daily walks of about three miles and yard work including push mowing. He denies any chest pain, shortness of breath, palpitations, edema, PND, orthopnea, presyncope or syncope. Family history of heart disease, with a brother who died from a massive heart attack at age 64. He quit smoking 15 years ago. Has been on atorvastatin for high cholesterol and reports occasional chest and leg cramps, which have improved since starting daily aspirin.  Family history: His family history includes Dementia in an other family member; Diabetes in his father and mother; Hypertension in an other family member; Tuberculosis in an other family member.  Brother had MI age 60,died   ASCVD Risk Score: The 10-year ASCVD risk score (Arnett DK, et al., 2019) is: 30%   Values used to calculate the score:     Age: 87 years     Sex: Male     Is Non-Hispanic African American: Yes     Diabetic: Yes     Tobacco smoker: No     Systolic Blood Pressure: 110 mmHg     Is BP treated: Yes     HDL Cholesterol: 37.7 mg/dL     Total Cholesterol: 168 mg/dL    Diet: Eats out frequently No dietary restrictions No  etoh  Activity: Push mows his lawn, other yard work [employer] 3 miles almost daily   ROS: See HPI       Studies Reviewed: [name]   EKG Interpretation Date/Time:  Wednesday November 13 2023 14:51:31 EST Ventricular Rate:  58 PR Interval:  158 QRS Duration:  86 QT Interval:  418 QTC Calculation: 410 R Axis:   -30  Text Interpretation: Sinus bradycardia Left axis deviation When compared with ECG of 04-Sep-2021 12:51, PREVIOUS ECG IS PRESENT Nonspecific ST abnormality Confirmed by Eligha Bridegroom [id number]) on 11/13/2023 2:58:10 PM      Risk Assessment/Calculations:             Physical Exam:   VS: BP 110/70   Pulse (!) 58   Ht 5\' 9"  (1.753 m)   Wt 197 lb (89.4 kg)   SpO2 95%   BMI 29.09 kg/m   Wt Readings from Last 3 Encounters:  11/13/23 197 lb (89.4 kg)  09/24/23 192 lb (87.1 kg)  09/24/23 192 lb (87.1 kg)     GEN: Well nourished, well developed in no acute distress NECK: No JVD; No carotid bruits CARDIAC: RRR, no murmurs, rubs, gallops RESPIRATORY:  Clear to auscultation without rales, wheezing or rhonchi  ABDOMEN: Soft, non-tender, non-distended EXTREMITIES:  No edema; No deformity     ASSESSMENT AND PLAN: .    CAD: Significantly  elevated coronary calcium score on CT of 1009 with calcification across all 4 major coronary arteries, most heavily distributed in LAD. He is active with walking for exercise and regular yard work.  He has chest discomfort as noted below but denies shortness of breath.  No associated symptoms of diaphoresis or n/v with chest discomfort. EKG today does not reveal ST abnormality.  ASCVD risk score is 30%.  Due to additional risk factors of hyperlipidemia, hypertension, diabetes, and family history, we will proceed with ischemia evaluation with coronary CTA.  Will have him take Lopressor 50 mg 2 hours prior to test. Continue aspirin, amlodipine, atorvastatin, and ezetimibe.  Chest pain/cramps: CT calcium score was ordered for further risk  stratification due to history of hyperlipidemia and chest pain/cramps. Reports symptoms do not worsen with exertion.  We are getting coronary CTA for mapping of coronary anatomy as noted above.  Preoperative cardiac evaluation: He is planning to undergo left total knee replacement, date TBD.  He is able to achieve > 4 METS activity without concerning cardiac symptoms, however we will get coronary CT angiogram to evaluate for obstruction due to significantly elevated coronary calcium in LAD. We will await results prior to completing risk evaluation. He is on low dose aspirin for prevention at this time.   Hypertension: BP is well-controlled. We will recheck renal function today for upcoming CT.   Hyperlipidemia: Lipid panel 09/24/23 with total cholesterol 168, triglycerides 184, HDL 37.70, LDL 94.  He has been taking atorvastatin 80 mg daily consistently for many years.  Advised him to add ezetimibe 10 mg daily, but later discovered he was already prescribed this medication.  It is unclear if he is taking it.  We will recheck fasting lipid panel and ALT and 2-3 months. Encouraged secondary prevention including heart healthy mostly plant based diet avoiding saturated fat, processed foods, simple carbohydrates, and sugar along with aiming for at least 150 minutes of moderate intensity exercise each week.   Plan/Goals: Incorporate more plant-based diet options and limit saturated fat, sugar, simple carbohydrates, and processed foods Continue to aim for at least 150 minutes of moderate intensity exercise each week        Dispo: 2-3 months with me  Signed, Eligha Bridegroom, NP-C

## 2023-11-13 ENCOUNTER — Telehealth (HOSPITAL_BASED_OUTPATIENT_CLINIC_OR_DEPARTMENT_OTHER): Payer: Self-pay | Admitting: *Deleted

## 2023-11-13 ENCOUNTER — Ambulatory Visit (HOSPITAL_BASED_OUTPATIENT_CLINIC_OR_DEPARTMENT_OTHER): Payer: Medicare Other | Admitting: Nurse Practitioner

## 2023-11-13 ENCOUNTER — Telehealth: Payer: Self-pay

## 2023-11-13 ENCOUNTER — Encounter (HOSPITAL_BASED_OUTPATIENT_CLINIC_OR_DEPARTMENT_OTHER): Payer: Self-pay | Admitting: Nurse Practitioner

## 2023-11-13 VITALS — BP 110/70 | HR 58 | Ht 69.0 in | Wt 197.0 lb

## 2023-11-13 DIAGNOSIS — R931 Abnormal findings on diagnostic imaging of heart and coronary circulation: Secondary | ICD-10-CM

## 2023-11-13 DIAGNOSIS — I2510 Atherosclerotic heart disease of native coronary artery without angina pectoris: Secondary | ICD-10-CM

## 2023-11-13 DIAGNOSIS — E785 Hyperlipidemia, unspecified: Secondary | ICD-10-CM | POA: Diagnosis not present

## 2023-11-13 DIAGNOSIS — R072 Precordial pain: Secondary | ICD-10-CM | POA: Diagnosis not present

## 2023-11-13 DIAGNOSIS — Z01810 Encounter for preprocedural cardiovascular examination: Secondary | ICD-10-CM | POA: Diagnosis not present

## 2023-11-13 DIAGNOSIS — I10 Essential (primary) hypertension: Secondary | ICD-10-CM | POA: Diagnosis not present

## 2023-11-13 DIAGNOSIS — Z7189 Other specified counseling: Secondary | ICD-10-CM

## 2023-11-13 MED ORDER — EZETIMIBE 10 MG PO TABS: 10.0000 mg | ORAL_TABLET | Freq: Every day | ORAL | 3 refills | Status: DC

## 2023-11-13 MED ORDER — METOPROLOL TARTRATE 50 MG PO TABS
ORAL_TABLET | ORAL | 0 refills | Status: DC
Start: 2023-11-13 — End: 2024-03-24

## 2023-11-13 NOTE — Telephone Encounter (Signed)
Patient was seen by Eligha Bridegroom, NP, earlier this afternoon. The note has not been completed yet but it looks like a coronary CTA was ordered for further evaluation of chest pain. Therefore, we will need to wait for this before providing pre-op risk assessment.   Corrin Parker, PA-C 11/13/2023 4:02 PM

## 2023-11-13 NOTE — Telephone Encounter (Signed)
   Pre-operative Risk Assessment    Patient Name: Calvin Wilcox  DOB: [birth year] MRN: [id number]  Last seen:11/13/2023  Eligha Bridegroom Next apt: 02/19/2024 , Eligha Bridegroom     Request for Surgical Clearance    Procedure:   Left total knee  Date of Surgery:  Clearance TBD                                 Surgeon:  Mila Homer. L[place]y Surgeon's Group or Practice Name:  [facility]  Phone number:  [phone] Fax number:  [phone]   Type of Clearance Requested:   - Medical  - Pharmacy:  Hold Aspirin     Type of Anesthesia:  Spinal   Additional requests/questions:   N/A  Signed, Stevan Born   11/13/2023, 3:50 PM

## 2023-11-13 NOTE — Telephone Encounter (Signed)
Pt was seen by Lebron Conners today. Pt brought paperwork for surgical clearance that was not complete. Called to [facility] and s/w Bridgett Larsson, Surgery Scheduler @ [phone] to complete paperwork.  Pre Op entered in system.

## 2023-11-13 NOTE — Patient Instructions (Signed)
Medication Instructions:   START Zetia one (1) tablet by mouth ( 10 mg ) daily.   *If you need a refill on your cardiac medications before your next appointment, please call your pharmacy*   Lab Work:  Your physician recommends that you return for a FASTING NMR/ALT. At the end of February.    If you have labs (blood work) drawn today and your tests are completely normal, you will receive your results only by: MyChart Message (if you have MyChart) OR A paper copy in the mail If you have any lab test that is abnormal or we need to change your treatment, we will call you to review the results.   Testing/Procedures:    Your cardiac CT will be scheduled at one of the below locations:   [facility] [address] [place], [place] [address] [phone]   If scheduled at [facility], please arrive at the [facility] and Children's Entrance (Entrance C2) of [facility] 30 minutes prior to test start time. You can use the FREE valet parking offered at entrance C (encouraged to control the heart rate for the test)  Proceed to the [facility] Radiology Department (first floor) to check-in and test prep.  All radiology patients and guests should use entrance C2 at [facility], accessed from [facility], even though the hospital's physical address listed is [address].    Please follow these instructions carefully (unless otherwise directed):  An IV will be required for this test and Nitroglycerin will be given.  Hold all erectile dysfunction medications at least 3 days (72 hrs) prior to test. (Ie viagra, cialis, sildenafil, tadalafil, etc)   On the Night Before the Test: Be sure to Drink plenty of water. Do not consume any caffeinated/decaffeinated beverages or chocolate 12 hours prior to your test. Do not take any antihistamines 12 hours prior to your test.  On the Day of the Test: Drink plenty of water until 1 hour prior  to the test. Do not eat any food 1 hour prior to test. You may take your regular medications prior to the test.  Take metoprolol (Lopressor) TAKE ONE (1) TABLET BY MOUTH ( 50 MG) two hours prior to test. If you take Furosemide/Hydrochlorothiazide, please HOLD on the morning of the test.  After the Test: Drink plenty of water. After receiving IV contrast, you may experience a mild flushed feeling. This is normal. On occasion, you may experience a mild rash up to 24 hours after the test. This is not dangerous. If this occurs, you can take Benadryl 25 mg and increase your fluid intake. If you experience trouble breathing, this can be serious. If it is severe call 911 IMMEDIATELY. If it is mild, please call our office.  We will call to schedule your test 2-4 weeks out understanding that some insurance companies will need an authorization prior to the service being performed.   For more information and frequently asked questions, please visit our website : [email or web address]  For non-scheduling related questions, please contact the cardiac imaging nurse navigator should you have any questions/concerns: Cardiac Imaging Nurse Navigators Direct Office Dial: [phone]   For scheduling needs, including cancellations and rescheduling, please call [place], [phone].    Follow-Up: At [facility], you and your health needs are our priority.  As part of our continuing mission to provide you with exceptional heart care, we have created designated Provider Care Teams.  These Care Teams include your primary  Cardiologist (physician) and Advanced Practice Providers (APPs -  Physician Assistants and Nurse Practitioners) who all work together to provide you with the care you need, when you need it.  We recommend signing up for the patient portal called "MyChart".  Sign up information is provided on this After Visit Summary.  MyChart is used to connect with patients for Virtual  Visits (Telemedicine).  Patients are able to view lab/test results, encounter notes, upcoming appointments, etc.  Non-urgent messages can be sent to your provider as well.   To learn more about what you can do with MyChart, go to [email or web address].    Your next appointment:   3 month(s)  Provider:   Eligha Bridegroom, NP    Other Instructions  Adopting a Healthy Lifestyle.   Weight: Know what a healthy weight is for you (roughly BMI <25) and aim to maintain this. You can calculate your body mass index on your smart phone. Unfortunately, this is not the most accurate measure of healthy weight, but it is the simplest measurement to use. A more accurate measurement involves body scanning which measures lean muscle, fat tissue and bony density. We do not have this equipment at [facility].    Diet: Aim for 7+ servings of fruits and vegetables daily Limit animal fats in diet for cholesterol and heart health - choose grass fed whenever available Avoid highly processed foods (fast food burgers, tacos, fried chicken, pizza, hot dogs, french fries)  Saturated fat comes in the form of butter, lard, coconut oil, margarine, partially hydrogenated oils, and fat in meat. These increase your risk of cardiovascular disease.  Use healthy plant oils, such as olive, canola, soy, corn, sunflower and peanut.  Whole foods such as fruits, vegetables and whole grains have fiber  Men need > 38 grams of fiber per day Women need > 25 grams of fiber per day  Load up on vegetables and fruits - one-half of your plate: Aim for color and variety, and remember that potatoes dont count. Go for whole grains - one-quarter of your plate: Whole wheat, barley, wheat berries, quinoa, oats, brown rice, and foods made with them. If you want pasta, go with whole wheat pasta. Protein power - one-quarter of your plate: Fish, chicken, beans, and nuts are all healthy, versatile protein sources. Limit red meat. You need carbohydrates  for energy! The type of carbohydrate is more important than the amount. Choose carbohydrates such as vegetables, fruits, whole grains, beans, and nuts in the place of white rice, white pasta, potatoes (baked or fried), macaroni and cheese, cakes, cookies, and donuts.  If youre thirsty, drink water. Coffee and tea are good in moderation, but skip sugary drinks and limit milk and dairy products to one or two daily servings. Keep sugar intake at 6 teaspoons or 24 grams or LESS       Exercise: Aim for 150 min of moderate intensity exercise weekly for heart health, and weights twice weekly for bone health Stay active - any steps are better than no steps! Aim for 7-9 hours of sleep daily          Mediterranean Diet  Why follow it? Research shows. Those who follow the Mediterranean diet have a reduced risk of heart disease  The diet is associated with a reduced incidence of Parkinson's and Alzheimer's diseases People following the diet may have longer life expectancies and lower rates of chronic diseases  The Dietary Guidelines for Americans recommends the Mediterranean diet as an eating plan to promote  health and prevent disease  What Is the Mediterranean Diet?  Healthy eating plan based on typical foods and recipes of Mediterranean-style cooking The diet is primarily a plant based diet; these foods should make up a majority of meals   Starches - Plant based foods should make up a majority of meals - They are an important sources of vitamins, minerals, energy, antioxidants, and fiber - Choose whole grains, foods high in fiber and minimally processed items  - Typical grain sources include wheat, oats, barley, corn, brown rice, bulgar, farro, millet, polenta, couscous  - Various types of beans include chickpeas, lentils, fava beans, black beans, white beans   Fruits  Veggies - Large quantities of antioxidant rich fruits & veggies; 6 or more servings  - Vegetables can be eaten raw or lightly  drizzled with oil and cooked  - Vegetables common to the traditional Mediterranean Diet include: artichokes, arugula, beets, broccoli, brussel sprouts, cabbage, carrots, celery, collard greens, cucumbers, eggplant, kale, leeks, lemons, lettuce, mushrooms, okra, onions, peas, peppers, potatoes, pumpkin, radishes, rutabaga, shallots, spinach, sweet potatoes, turnips, zucchini - Fruits common to the Mediterranean Diet include: apples, apricots, avocados, cherries, clementines, dates, figs, grapefruits, grapes, melons, nectarines, oranges, peaches, pears, pomegranates, strawberries, tangerines  Fats - Replace butter and margarine with healthy oils, such as olive oil, canola oil, and tahini  - Limit nuts to no more than a handful a day  - Nuts include walnuts, almonds, pecans, pistachios, pine nuts  - Limit or avoid candied, honey roasted or heavily salted nuts - Olives are central to the [employer] - can be eaten whole or used in a variety of dishes   Meats Protein - Limiting red meat: no more than a few times a month - When eating red meat: choose lean cuts and keep the portion to the size of deck of cards - Eggs: approx. 0 to 4 times a week  - Fish and lean poultry: at least 2 a week  - Healthy protein sources include, chicken, [place], lean beef, lamb - Increase intake of seafood such as tuna, salmon, trout, mackerel, shrimp, scallops - Avoid or limit high fat processed meats such as sausage and bacon  Dairy - Include moderate amounts of low fat dairy products  - Focus on healthy dairy such as fat free yogurt, skim milk, low or reduced fat cheese - Limit dairy products higher in fat such as whole or 2% milk, cheese, ice cream  Alcohol - Moderate amounts of red wine is ok  - No more than 5 oz daily for women (all ages) and men older than age 47  - No more than 10 oz of wine daily for men younger than 61  Other - Limit sweets and other desserts  - Use herbs and spices instead of salt to  flavor foods  - Herbs and spices common to the traditional Mediterranean Diet include: basil, bay leaves, chives, cloves, cumin, fennel, garlic, lavender, marjoram, mint, oregano, parsley, pepper, rosemary, sage, savory, sumac, tarragon, thyme   It's not just a diet, it's a lifestyle:  The Mediterranean diet includes lifestyle factors typical of those in the region  Foods, drinks and meals are best eaten with others and savored Daily physical activity is important for overall good health This could be strenuous exercise like running and aerobics This could also be more leisurely activities such as walking, housework, yard-work, or taking the stairs Moderation is the key; a balanced and healthy diet accommodates most foods and drinks Consider  portion sizes and frequency of consumption of certain foods   Meal Ideas & Options:  Breakfast:  Whole wheat toast or whole wheat English muffins with peanut butter & hard boiled egg Steel cut oats topped with apples & cinnamon and skim milk  Fresh fruit: banana, strawberries, melon, berries, peaches  Smoothies: strawberries, bananas, greek yogurt, peanut butter Low fat greek yogurt with blueberries and granola  Egg white omelet with spinach and mushrooms Breakfast couscous: whole wheat couscous, apricots, skim milk, cranberries  Sandwiches:  Hummus and grilled vegetables (peppers, zucchini, squash) on whole wheat bread   Grilled chicken on whole wheat pita with lettuce, tomatoes, cucumbers or tzatziki  [place] salad on whole wheat bread: tuna salad made with greek yogurt, olives, red peppers, capers, green onions Garlic rosemary lamb pita: lamb sauted with garlic, rosemary, salt & pepper; add lettuce, cucumber, greek yogurt to pita - flavor with lemon juice and black pepper  Seafood:  Mediterranean grilled salmon, seasoned with garlic, basil, parsley, lemon juice and black pepper Shrimp, lemon, and spinach whole-grain pasta salad made with low fat  greek yogurt  Seared scallops with lemon orzo  Seared tuna steaks seasoned salt, pepper, coriander topped with tomato mixture of olives, tomatoes, olive oil, minced garlic, parsley, green onions and cappers  Meats:  Herbed greek chicken salad with kalamata olives, cucumber, feta  Red bell peppers stuffed with spinach, bulgur, lean ground beef (or lentils) & topped with feta   Kebabs: skewers of chicken, tomatoes, onions, zucchini, squash  [place] burgers: made with red onions, mint, dill, lemon juice, feta cheese topped with roasted red peppers Vegetarian Cucumber salad: cucumbers, artichoke hearts, celery, red onion, feta cheese, tossed in olive oil & lemon juice  Hummus and whole grain pita points with a greek salad (lettuce, tomato, feta, olives, cucumbers, red onion) Lentil soup with celery, carrots made with vegetable broth, garlic, salt and pepper  Tabouli salad: parsley, bulgur, mint, scallions, cucumbers, tomato, radishes, lemon juice, olive oil, salt and pepper.

## 2023-11-19 ENCOUNTER — Telehealth: Payer: Self-pay | Admitting: Internal Medicine

## 2023-11-19 NOTE — Telephone Encounter (Signed)
Pt needs to contact pharmacy a year supply was sent on 09/24/23.

## 2023-11-19 NOTE — Telephone Encounter (Signed)
There is no phone note stating a form was dropped off.

## 2023-11-19 NOTE — Telephone Encounter (Signed)
Prescription Request  11/19/2023  LOV: 09/24/2023  What is the name of the medication or equipment?  triamterene-hydrochlorothiazide (MAXZIDE-25) 37.5-25 MG tablet    Have you contacted your pharmacy to request a refill? No   Which pharmacy would you like this sent to?  [id number] - [place], [place] - [address] AT [address] [place] [address] Phone: [phone] Fax: [phone]    Patient notified that their request is being sent to the clinical staff for review and that they should receive a response within 2 business days.   Please advise at Mobile [phone] (mobile)

## 2023-11-19 NOTE — Telephone Encounter (Signed)
Pt wife calling about the status of a form that drop off at the office for to release pt so he can have his surgery. Please advise.

## 2023-11-25 ENCOUNTER — Other Ambulatory Visit: Payer: Self-pay

## 2023-11-25 MED ORDER — TRIAMTERENE-HCTZ 37.5-25 MG PO TABS: 1.0000 | ORAL_TABLET | Freq: Every day | ORAL | 3 refills | Status: DC

## 2023-11-25 NOTE — Telephone Encounter (Signed)
Patient's wife has dropped off form for surgical clearance. Would like it faxed when it is completed.

## 2023-11-25 NOTE — Telephone Encounter (Signed)
Pharmacy is stating that new prescription needs to be sent in.

## 2023-11-25 NOTE — Telephone Encounter (Signed)
Form placed on the provider's desk.

## 2023-11-25 NOTE — Telephone Encounter (Signed)
New prescription sent

## 2023-11-26 ENCOUNTER — Other Ambulatory Visit (HOSPITAL_COMMUNITY): Payer: Medicare Other

## 2023-11-28 ENCOUNTER — Ambulatory Visit (HOSPITAL_COMMUNITY): Admission: RE | Admit: 2023-11-28 | Payer: Medicare Other | Source: Ambulatory Visit

## 2023-11-28 NOTE — Telephone Encounter (Signed)
Form has been faxed.

## 2023-12-06 NOTE — Telephone Encounter (Signed)
Requesting office inquiring on clearance for pt. I have reviewed with the pre op APP today Reather Littler, NP who reviewed chart and states the Cardiac CT has not been done yet due to pt has not had labs done yet. I will update the surgeon office the pt is going to need Cardiac CT before he can be cleared. Once the pt has been cleared our office will be sure to fax clearance notes and any recommendations.

## 2023-12-12 ENCOUNTER — Telehealth: Payer: Self-pay | Admitting: Nurse Practitioner

## 2023-12-12 NOTE — Telephone Encounter (Signed)
-----   Message from Rosabel BIRCH [place] sent at 12/06/2023 12:53 PM EST ----- Regarding: Coronary CTA Hi Teddie Mehta,  Can you take a look at this patients coronary CTA order? The surgeons office requested an update on his surgical clearance but looks as though the CTA has been canceled twice. Not sure if its related to his kidney function or if his labs are simply to old.   Thanks, Katlyn West, NP

## 2023-12-18 ENCOUNTER — Other Ambulatory Visit (HOSPITAL_BASED_OUTPATIENT_CLINIC_OR_DEPARTMENT_OTHER): Payer: Self-pay | Admitting: *Deleted

## 2023-12-18 DIAGNOSIS — R7982 Elevated C-reactive protein (CRP): Secondary | ICD-10-CM

## 2023-12-18 NOTE — Telephone Encounter (Signed)
 S/w pt is coming to [facility] today for bmet. Placed orders and released.

## 2023-12-18 NOTE — Telephone Encounter (Signed)
 Please contact patient to let him know that we need a BMET today or as soon as possible to determine his kidney function. If his creatinine remains above 1.5, we will have to change order to lexiscan  myoview . His surgical clearance is pending further evaluation with one of these tests to look for ischemia.

## 2023-12-18 NOTE — Telephone Encounter (Signed)
 I am helping in preop today. There appears to be a second generated phone note 12/12/23 with a message to the APP that saw patient in office 11/13/23. Coronary CTA planned but not yet completed for unclear reasons, patient's last Cr noted to be 2.13.  ----- Message from Rosabel JONETTA Mose sent at 12/06/2023 12:53 PM EST ----- Regarding: Coronary CTA Hi Michelle,   Can you take a look at this patients coronary CTA order? The surgeons office requested an update on his surgical clearance but looks as though the CTA has been canceled twice. Not sure if its related to his kidney function or if his labs are simply to old.    Thanks, Katlyn West, NP    This patient needs clarification of plan for coronary CTA given CKD, will route to Rosaline Bane so that this information is available to preop team.

## 2023-12-19 ENCOUNTER — Telehealth: Payer: Self-pay

## 2023-12-19 DIAGNOSIS — R072 Precordial pain: Secondary | ICD-10-CM

## 2023-12-19 DIAGNOSIS — R931 Abnormal findings on diagnostic imaging of heart and coronary circulation: Secondary | ICD-10-CM

## 2023-12-19 LAB — BASIC METABOLIC PANEL
BUN/Creatinine Ratio: 8 — ABNORMAL LOW (ref 10–24)
BUN: 16 mg/dL (ref 8–27)
CO2: 26 mmol/L (ref 20–29)
Calcium: 9.3 mg/dL (ref 8.6–10.2)
Chloride: 98 mmol/L (ref 96–106)
Creatinine, Ser: 1.97 mg/dL — ABNORMAL HIGH (ref 0.76–1.27)
Glucose: 92 mg/dL (ref 70–99)
Potassium: 3.9 mmol/L (ref 3.5–5.2)
Sodium: 139 mmol/L (ref 134–144)
eGFR: 35 mL/min/{1.73_m2} — ABNORMAL LOW (ref >59)

## 2023-12-20 ENCOUNTER — Telehealth (HOSPITAL_COMMUNITY): Payer: Self-pay | Admitting: *Deleted

## 2023-12-20 NOTE — Telephone Encounter (Signed)
 Patient given detailed instructions per Myocardial Perfusion Study Information Sheet for the test on 12/25/2023 at 12:45. Patient notified to arrive 15 minutes early and that it is imperative to arrive on time for appointment to keep from having the test rescheduled.  If you need to cancel or reschedule your appointment, please call the office within 24 hours of your appointment. . Patient verbalized understanding.Calvin Wilcox

## 2023-12-20 NOTE — Telephone Encounter (Signed)
 Steffanie Dunn ordered and attestation signed

## 2023-12-25 ENCOUNTER — Ambulatory Visit (HOSPITAL_COMMUNITY): Payer: Medicare Other | Attending: Nurse Practitioner

## 2023-12-25 DIAGNOSIS — R072 Precordial pain: Secondary | ICD-10-CM | POA: Diagnosis not present

## 2023-12-25 DIAGNOSIS — R931 Abnormal findings on diagnostic imaging of heart and coronary circulation: Secondary | ICD-10-CM | POA: Diagnosis not present

## 2023-12-25 MED ORDER — TECHNETIUM TC 99M TETROFOSMIN IV KIT
10.2000 | PACK | Freq: Once | INTRAVENOUS | Status: AC | PRN
  Administered 2023-12-25: 10.2 via INTRAVENOUS

## 2023-12-30 ENCOUNTER — Ambulatory Visit (HOSPITAL_COMMUNITY): Payer: Medicare Other | Attending: Cardiovascular Disease

## 2023-12-30 DIAGNOSIS — R072 Precordial pain: Secondary | ICD-10-CM | POA: Insufficient documentation

## 2023-12-30 DIAGNOSIS — R931 Abnormal findings on diagnostic imaging of heart and coronary circulation: Secondary | ICD-10-CM | POA: Insufficient documentation

## 2023-12-30 LAB — MYOCARDIAL PERFUSION IMAGING
LV dias vol: 95 mL (ref 62–150)
LV sys vol: 41 mL
Nuc Stress EF: 57 %
Peak HR: 90 {beats}/min
Rest HR: 60 {beats}/min
Rest Nuclear Isotope Dose: 10.2 mCi
SDS: 0
SRS: 0
SSS: 0
ST Depression (mm): 0 mm
Stress Nuclear Isotope Dose: 31.8 mCi
TID: 1.12

## 2023-12-30 MED ORDER — TECHNETIUM TC 99M TETROFOSMIN IV KIT
31.8000 | PACK | Freq: Once | INTRAVENOUS | Status: AC | PRN
  Administered 2023-12-30: 31.8 via INTRAVENOUS

## 2023-12-30 MED ORDER — REGADENOSON 0.4 MG/5ML IV SOLN
0.4000 mg | Freq: Once | INTRAVENOUS | Status: AC
  Administered 2023-12-30: 0.4 mg via INTRAVENOUS

## 2023-12-31 ENCOUNTER — Encounter (HOSPITAL_BASED_OUTPATIENT_CLINIC_OR_DEPARTMENT_OTHER): Payer: Self-pay

## 2023-12-31 NOTE — Telephone Encounter (Signed)
   Primary Cardiologist: Chilton Si, MD  Chart reviewed as part of pre-operative protocol coverage. He underwent nuclear stress test on 12/30/2023 which was normal. Given past medical history and time since last visit, based on ACC/AHA guidelines, Calvin Wilcox would be at acceptable risk for the planned procedure without further cardiovascular testing.   Patient was advised that if he develops new symptoms prior to surgery to contact our office to arrange a follow-up appointment.  He verbalized understanding.  Per office protocol, he may hold aspirin for 5-7 days prior to procedure and should resume as soon as hemodynamically stable postoperatively.  I will route this recommendation to the requesting party via Epic fax function and remove from pre-op pool.  Please call with questions.  Levi Aland, NP-C 12/31/2023, 7:04 AM [address], [address] Office [phone] Fax [phone]

## 2024-01-14 DIAGNOSIS — G8929 Other chronic pain: Secondary | ICD-10-CM | POA: Diagnosis not present

## 2024-01-14 DIAGNOSIS — M25562 Pain in left knee: Secondary | ICD-10-CM | POA: Diagnosis not present

## 2024-01-14 LAB — HM DIABETES EYE EXAM

## 2024-01-16 ENCOUNTER — Encounter: Payer: Self-pay | Admitting: Internal Medicine

## 2024-01-23 DIAGNOSIS — E119 Type 2 diabetes mellitus without complications: Secondary | ICD-10-CM | POA: Diagnosis not present

## 2024-01-23 DIAGNOSIS — M1712 Unilateral primary osteoarthritis, left knee: Secondary | ICD-10-CM | POA: Diagnosis not present

## 2024-02-05 DIAGNOSIS — N183 Chronic kidney disease, stage 3 unspecified: Secondary | ICD-10-CM | POA: Diagnosis not present

## 2024-02-05 DIAGNOSIS — E1122 Type 2 diabetes mellitus with diabetic chronic kidney disease: Secondary | ICD-10-CM | POA: Diagnosis not present

## 2024-02-05 DIAGNOSIS — I129 Hypertensive chronic kidney disease with stage 1 through stage 4 chronic kidney disease, or unspecified chronic kidney disease: Secondary | ICD-10-CM | POA: Diagnosis not present

## 2024-02-07 DIAGNOSIS — M898X9 Other specified disorders of bone, unspecified site: Secondary | ICD-10-CM | POA: Diagnosis not present

## 2024-02-07 DIAGNOSIS — I129 Hypertensive chronic kidney disease with stage 1 through stage 4 chronic kidney disease, or unspecified chronic kidney disease: Secondary | ICD-10-CM | POA: Diagnosis not present

## 2024-02-07 DIAGNOSIS — E559 Vitamin D deficiency, unspecified: Secondary | ICD-10-CM | POA: Diagnosis not present

## 2024-02-07 DIAGNOSIS — N1832 Chronic kidney disease, stage 3b: Secondary | ICD-10-CM | POA: Diagnosis not present

## 2024-02-07 DIAGNOSIS — E1122 Type 2 diabetes mellitus with diabetic chronic kidney disease: Secondary | ICD-10-CM | POA: Diagnosis not present

## 2024-02-07 DIAGNOSIS — Z7984 Long term (current) use of oral hypoglycemic drugs: Secondary | ICD-10-CM | POA: Diagnosis not present

## 2024-02-07 DIAGNOSIS — D631 Anemia in chronic kidney disease: Secondary | ICD-10-CM | POA: Diagnosis not present

## 2024-02-17 NOTE — Progress Notes (Deleted)
 Cardiology Office Note:  .   Date:  02/17/2024 ID:  Calvin Wilcox, DOB 05-04-1951, MRN [id number] PCP: Corwin Levins, MD [facility] Providers Cardiologist:  None   Patient Profile: .      PMH Elevated coronary artery calcium score CT calcium score 10/16/23 CAC score 1009 (80th percentile) LM 14, LAD 954, LCx 2, RCA 39 Hyperlipidemia Hypertension Long Q-T syndrome Paranoid schizophrenia Type 2 diabetes mellitus CKD Stage 3 Former tobacco abuse Quit 10-15 years     Referred to cardiology and seen by me on 11/13/23 as a new patient consult for elevated coronary artery calcium score on CT 10/16/2023. He was advised to start aspirin 81 mg daily. His wife reports the CT was ordered due to frequent chest and leg cramps. He reports active lifestyle with daily walks of about three miles and yard work including push mowing. He denied chest pain, shortness of breath, palpitations, edema, PND, orthopnea, presyncope or syncope. Family history of heart disease, with a brother who died from a massive heart attack at age 64. Pt quit smoking 15 years ago. Has been on atorvastatin for high cholesterol and reports occasional chest and leg cramps, which have improved since starting daily aspirin. LDL was 94 on atorvastatin 80 mg. Ezetimibe 10 mg daily was added for goal LDL < 70. ASCVD Risk score is 37%. He admitted to unrestricted diet with frequent eating out, no Etoh. Additional ischemia evaluation felt necessary pending knee surgery, unfortunately his Scr was elevated and prevented coronary CTA. He underwent lexiscan myoview on 12/25/23 which was low risk.     History of Present Illness: .   Calvin Wilcox is a pleasant 73 y.o. male  who is here today for     ROS: See HPI       Studies Reviewed: .          Risk Assessment/Calculations:     No BP recorded.  {Refresh Note OR Click here to enter BP  :1}***       Physical Exam:   VS: There were no vitals taken for this visit.  Wt Readings  from Last 3 Encounters:  12/25/23 197 lb (89.4 kg)  11/13/23 197 lb (89.4 kg)  09/24/23 192 lb (87.1 kg)     GEN: Well nourished, well developed in no acute distress NECK: No JVD; No carotid bruits CARDIAC: RRR, no murmurs, rubs, gallops RESPIRATORY:  Clear to auscultation without rales, wheezing or rhonchi  ABDOMEN: Soft, non-tender, non-distended EXTREMITIES:  No edema; No deformity     ASSESSMENT AND PLAN: .    CAD: Significantly elevated coronary calcium score on CT of 1009 with calcification across all 4 major coronary arteries, most heavily distributed in LAD. He is active with walking for exercise and regular yard work.  He has chest discomfort as noted below but denies shortness of breath.  No associated symptoms of diaphoresis or n/v with chest discomfort. EKG today does not reveal ST abnormality.  ASCVD risk score is 30%.  Due to additional risk factors of hyperlipidemia, hypertension, diabetes, and family history, we will proceed with ischemia evaluation with coronary CTA.  Will have him take Lopressor 50 mg 2 hours prior to test. Continue aspirin, amlodipine, atorvastatin, and ezetimibe.  Chest pain/cramps: CT calcium score was ordered for further risk stratification due to history of hyperlipidemia and chest pain/cramps. Reports symptoms do not worsen with exertion.  We are getting coronary CTA for mapping of coronary anatomy as noted above.  CKD   Hypertension:  BP is well-controlled. We will recheck renal function today for upcoming CT.   Hyperlipidemia: Lipid panel 09/24/23 with total cholesterol 168, triglycerides 184, HDL 37.70, LDL 94.  He has been taking atorvastatin 80 mg daily consistently for many years.  Advised him to add ezetimibe 10 mg daily, but later discovered he was already prescribed this medication.  It is unclear if he is taking it.  We will recheck fasting lipid panel and ALT and 2-3 months. Encouraged secondary prevention including heart healthy mostly plant  based diet avoiding saturated fat, processed foods, simple carbohydrates, and sugar along with aiming for at least 150 minutes of moderate intensity exercise each week.   Plan/Goals: Incorporate more plant-based diet options and limit saturated fat, sugar, simple carbohydrates, and processed foods Continue to aim for at least 150 minutes of moderate intensity exercise each week     Disposition: ***  Signed, Eligha Bridegroom, NP-C

## 2024-02-19 ENCOUNTER — Ambulatory Visit (HOSPITAL_BASED_OUTPATIENT_CLINIC_OR_DEPARTMENT_OTHER): Payer: Medicare Other | Admitting: Nurse Practitioner

## 2024-03-09 ENCOUNTER — Other Ambulatory Visit: Payer: Self-pay | Admitting: Internal Medicine

## 2024-03-20 DIAGNOSIS — G8929 Other chronic pain: Secondary | ICD-10-CM | POA: Diagnosis not present

## 2024-03-20 DIAGNOSIS — M25562 Pain in left knee: Secondary | ICD-10-CM | POA: Diagnosis not present

## 2024-03-20 DIAGNOSIS — M1712 Unilateral primary osteoarthritis, left knee: Secondary | ICD-10-CM | POA: Diagnosis not present

## 2024-03-24 ENCOUNTER — Ambulatory Visit (INDEPENDENT_AMBULATORY_CARE_PROVIDER_SITE_OTHER): Payer: Medicare Other | Admitting: Internal Medicine

## 2024-03-24 ENCOUNTER — Encounter: Payer: Self-pay | Admitting: Internal Medicine

## 2024-03-24 VITALS — BP 130/80 | HR 58 | Temp 98.9°F | Ht 69.0 in | Wt 197.0 lb

## 2024-03-24 DIAGNOSIS — N1831 Chronic kidney disease, stage 3a: Secondary | ICD-10-CM

## 2024-03-24 DIAGNOSIS — I10 Essential (primary) hypertension: Secondary | ICD-10-CM | POA: Diagnosis not present

## 2024-03-24 DIAGNOSIS — E1165 Type 2 diabetes mellitus with hyperglycemia: Secondary | ICD-10-CM | POA: Diagnosis not present

## 2024-03-24 DIAGNOSIS — Z125 Encounter for screening for malignant neoplasm of prostate: Secondary | ICD-10-CM

## 2024-03-24 DIAGNOSIS — E7800 Pure hypercholesterolemia, unspecified: Secondary | ICD-10-CM

## 2024-03-24 DIAGNOSIS — Z0001 Encounter for general adult medical examination with abnormal findings: Secondary | ICD-10-CM

## 2024-03-24 DIAGNOSIS — J209 Acute bronchitis, unspecified: Secondary | ICD-10-CM

## 2024-03-24 DIAGNOSIS — R059 Cough, unspecified: Secondary | ICD-10-CM

## 2024-03-24 DIAGNOSIS — E538 Deficiency of other specified B group vitamins: Secondary | ICD-10-CM | POA: Diagnosis not present

## 2024-03-24 DIAGNOSIS — E559 Vitamin D deficiency, unspecified: Secondary | ICD-10-CM | POA: Diagnosis not present

## 2024-03-24 DIAGNOSIS — R509 Fever, unspecified: Secondary | ICD-10-CM

## 2024-03-24 LAB — BASIC METABOLIC PANEL WITH GFR
BUN: 17 mg/dL (ref 6–23)
CO2: 30 meq/L (ref 19–32)
Calcium: 9 mg/dL (ref 8.4–10.5)
Chloride: 101 meq/L (ref 96–112)
Creatinine, Ser: 2.07 mg/dL — ABNORMAL HIGH (ref 0.40–1.50)
GFR: 30.98 mL/min — ABNORMAL LOW (ref >60.00)
Glucose, Bld: 129 mg/dL — ABNORMAL HIGH (ref 70–99)
Potassium: 4.1 meq/L (ref 3.5–5.1)
Sodium: 138 meq/L (ref 135–145)

## 2024-03-24 LAB — HEMOGLOBIN A1C: Hgb A1c MFr Bld: 7 % — ABNORMAL HIGH (ref 4.6–6.5)

## 2024-03-24 LAB — URINALYSIS, ROUTINE W REFLEX MICROSCOPIC
Bilirubin Urine: NEGATIVE
Hgb urine dipstick: NEGATIVE
Ketones, ur: NEGATIVE
Nitrite: NEGATIVE
Specific Gravity, Urine: 1.025 (ref 1.000–1.030)
Total Protein, Urine: NEGATIVE
Urine Glucose: NEGATIVE
Urobilinogen, UA: 0.2 (ref 0.0–1.0)
pH: 6 (ref 5.0–8.0)

## 2024-03-24 LAB — LIPID PANEL
Cholesterol: 115 mg/dL (ref 0–200)
HDL: 36.9 mg/dL — ABNORMAL LOW (ref >39.00)
LDL Cholesterol: 58 mg/dL (ref 0–99)
NonHDL: 78.26
Total CHOL/HDL Ratio: 3
Triglycerides: 103 mg/dL (ref 0.0–149.0)
VLDL: 20.6 mg/dL (ref 0.0–40.0)

## 2024-03-24 LAB — CBC WITH DIFFERENTIAL/PLATELET
Basophils Absolute: 0.1 10*3/uL (ref 0.0–0.1)
Basophils Relative: 1.1 % (ref 0.0–3.0)
Eosinophils Absolute: 0.2 10*3/uL (ref 0.0–0.7)
Eosinophils Relative: 3 % (ref 0.0–5.0)
HCT: 42.4 % (ref 39.0–52.0)
Hemoglobin: 13.9 g/dL (ref 13.0–17.0)
Lymphocytes Relative: 36.4 % (ref 12.0–46.0)
Lymphs Abs: 1.9 10*3/uL (ref 0.7–4.0)
MCHC: 32.9 g/dL (ref 30.0–36.0)
MCV: 86 fl (ref 78.0–100.0)
Monocytes Absolute: 0.6 10*3/uL (ref 0.1–1.0)
Monocytes Relative: 11.5 % (ref 3.0–12.0)
Neutro Abs: 2.5 10*3/uL (ref 1.4–7.7)
Neutrophils Relative %: 48 % (ref 43.0–77.0)
Platelets: 349 10*3/uL (ref 150.0–400.0)
RBC: 4.93 Mil/uL (ref 4.22–5.81)
RDW: 16.8 % — ABNORMAL HIGH (ref 11.5–15.5)
WBC: 5.3 10*3/uL (ref 4.0–10.5)

## 2024-03-24 LAB — HEPATIC FUNCTION PANEL
ALT: 22 U/L (ref 0–53)
AST: 25 U/L (ref 0–37)
Albumin: 4.4 g/dL (ref 3.5–5.2)
Alkaline Phosphatase: 74 U/L (ref 39–117)
Bilirubin, Direct: 0.2 mg/dL (ref 0.0–0.3)
Total Bilirubin: 0.8 mg/dL (ref 0.2–1.2)
Total Protein: 7 g/dL (ref 6.0–8.3)

## 2024-03-24 LAB — TSH: TSH: 1.5 u[IU]/mL (ref 0.35–5.50)

## 2024-03-24 LAB — VITAMIN D 25 HYDROXY (VIT D DEFICIENCY, FRACTURES): VITD: 54.04 ng/mL (ref 30.00–100.00)

## 2024-03-24 LAB — MICROALBUMIN / CREATININE URINE RATIO
Creatinine,U: 185.6 mg/dL
Microalb Creat Ratio: 12.8 mg/g (ref 0.0–30.0)
Microalb, Ur: 2.4 mg/dL — ABNORMAL HIGH (ref 0.0–1.9)

## 2024-03-24 LAB — VITAMIN B12: Vitamin B-12: 1001 pg/mL — ABNORMAL HIGH (ref 211–911)

## 2024-03-24 LAB — PSA: PSA: 1.36 ng/mL (ref 0.10–4.00)

## 2024-03-24 MED ORDER — TAMSULOSIN HCL 0.4 MG PO CAPS
ORAL_CAPSULE | ORAL | 3 refills | Status: AC
Start: 2024-03-24 — End: ?

## 2024-03-24 MED ORDER — ATORVASTATIN CALCIUM 80 MG PO TABS: 80.0000 mg | ORAL_TABLET | Freq: Every day | ORAL | 3 refills | Status: AC

## 2024-03-24 MED ORDER — OMEPRAZOLE 20 MG PO CPDR: 20.0000 mg | DELAYED_RELEASE_CAPSULE | Freq: Two times a day (BID) | ORAL | 3 refills | Status: AC

## 2024-03-24 MED ORDER — AMLODIPINE BESYLATE 10 MG PO TABS
ORAL_TABLET | ORAL | 3 refills | Status: AC
Start: 2024-03-24 — End: ?

## 2024-03-24 MED ORDER — GLIPIZIDE ER 5 MG PO TB24: ORAL_TABLET | ORAL | 3 refills | Status: AC

## 2024-03-24 MED ORDER — ALBUTEROL SULFATE HFA 108 (90 BASE) MCG/ACT IN AERS: 2.0000 | INHALATION_SPRAY | Freq: Four times a day (QID) | RESPIRATORY_TRACT | 5 refills | Status: AC | PRN

## 2024-03-24 MED ORDER — TRIAMTERENE-HCTZ 37.5-25 MG PO TABS: 1.0000 | ORAL_TABLET | Freq: Every day | ORAL | 3 refills | Status: AC

## 2024-03-24 MED ORDER — EZETIMIBE 10 MG PO TABS: 10.0000 mg | ORAL_TABLET | Freq: Every day | ORAL | 3 refills | Status: AC

## 2024-03-24 NOTE — Assessment & Plan Note (Signed)
 BP Readings from Last 3 Encounters:  03/24/24 130/80  11/13/23 110/70  09/24/23 120/78   Stable, pt to continue medical treatment norvasc 10 every day, maxide 25 qd

## 2024-03-24 NOTE — Patient Instructions (Addendum)

## 2024-03-24 NOTE — Assessment & Plan Note (Signed)
 Lab Results  Component Value Date   HGBA1C 6.7 (H) 09/24/2023   Stable, pt to continue current medical treatment glucotrol xl 5 mg every day, for f/u lab today
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
## 2024-03-24 NOTE — Assessment & Plan Note (Signed)
Lab Results  Component Value Date   VITAMINB12 1,153 (H) 09/24/2023   Stable, cont oral replacement - b12 1000 mcg qd

## 2024-03-24 NOTE — Assessment & Plan Note (Signed)
Last vitamin D Lab Results  Component Value Date   VD25OH 52.38 09/24/2023   Stable, cont oral replacement

## 2024-03-24 NOTE — Assessment & Plan Note (Signed)
 Lab Results  Component Value Date   CREATININE 1.97 (H) 12/18/2023   Stable overall, cont to avoid nephrotoxins, for f/u lab

## 2024-03-24 NOTE — Progress Notes (Signed)
 The test results show that your current treatment is OK, as the tests are stable.  Please continue the same plan.  There is no other need for change of treatment or further evaluation based on these results, at this time.  thanks

## 2024-03-24 NOTE — Assessment & Plan Note (Signed)
Age and sex appropriate education and counseling updated with regular exercise and diet Referrals for preventative services - none needed Immunizations addressed - declines shingrix Smoking counseling  - none needed Evidence for depression or other mood disorder - none significant Most recent labs reviewed. I have personally reviewed and have noted: 1) the patient's medical and social history 2) The patient's current medications and supplements 3) The patient's height, weight, and BMI have been recorded in the chart  

## 2024-03-24 NOTE — Assessment & Plan Note (Signed)
 Lab Results  Component Value Date   LDLCALC 94 09/24/2023   uncontrolled, pt to continue current statin lipitor 80 mg and zetia 10 mg - declines add repatha, for f/u lab today

## 2024-03-30 DIAGNOSIS — M1712 Unilateral primary osteoarthritis, left knee: Secondary | ICD-10-CM | POA: Diagnosis not present

## 2024-03-30 DIAGNOSIS — G8918 Other acute postprocedural pain: Secondary | ICD-10-CM | POA: Diagnosis not present

## 2024-04-09 DIAGNOSIS — M79651 Pain in right thigh: Secondary | ICD-10-CM | POA: Diagnosis not present

## 2024-04-16 DIAGNOSIS — M79651 Pain in right thigh: Secondary | ICD-10-CM | POA: Diagnosis not present

## 2024-04-20 DIAGNOSIS — M79651 Pain in right thigh: Secondary | ICD-10-CM | POA: Diagnosis not present

## 2024-04-24 DIAGNOSIS — M79651 Pain in right thigh: Secondary | ICD-10-CM | POA: Diagnosis not present

## 2024-04-29 DIAGNOSIS — M79651 Pain in right thigh: Secondary | ICD-10-CM | POA: Diagnosis not present

## 2024-04-30 DIAGNOSIS — M79651 Pain in right thigh: Secondary | ICD-10-CM | POA: Diagnosis not present

## 2024-05-13 DIAGNOSIS — M79651 Pain in right thigh: Secondary | ICD-10-CM | POA: Diagnosis not present

## 2024-05-19 DIAGNOSIS — M79651 Pain in right thigh: Secondary | ICD-10-CM | POA: Diagnosis not present

## 2024-05-27 DIAGNOSIS — M79651 Pain in right thigh: Secondary | ICD-10-CM | POA: Diagnosis not present

## 2024-08-03 DIAGNOSIS — C641 Malignant neoplasm of right kidney, except renal pelvis: Secondary | ICD-10-CM | POA: Diagnosis not present

## 2024-08-04 DIAGNOSIS — C641 Malignant neoplasm of right kidney, except renal pelvis: Secondary | ICD-10-CM | POA: Diagnosis not present

## 2024-08-31 ENCOUNTER — Ambulatory Visit (INDEPENDENT_AMBULATORY_CARE_PROVIDER_SITE_OTHER): Admitting: Internal Medicine

## 2024-08-31 VITALS — BP 108/68 | HR 59 | Temp 98.1°F | Ht 69.0 in | Wt 187.6 lb

## 2024-08-31 DIAGNOSIS — N1831 Chronic kidney disease, stage 3a: Secondary | ICD-10-CM | POA: Diagnosis not present

## 2024-08-31 DIAGNOSIS — J069 Acute upper respiratory infection, unspecified: Secondary | ICD-10-CM | POA: Insufficient documentation

## 2024-08-31 DIAGNOSIS — I10 Essential (primary) hypertension: Secondary | ICD-10-CM

## 2024-08-31 DIAGNOSIS — E1165 Type 2 diabetes mellitus with hyperglycemia: Secondary | ICD-10-CM

## 2024-08-31 MED ORDER — HYDROCODONE BIT-HOMATROP MBR 5-1.5 MG/5ML PO SOLN: 5.0000 mL | Freq: Four times a day (QID) | ORAL | 0 refills | Status: AC | PRN

## 2024-08-31 MED ORDER — AZITHROMYCIN 250 MG PO TABS: ORAL_TABLET | ORAL | 1 refills | Status: AC

## 2024-08-31 NOTE — Assessment & Plan Note (Addendum)
 Lab Results  Component Value Date   CREATININE 2.07 (H) 03/24/2024   Stable overall, cont to avoid nephrotoxins

## 2024-08-31 NOTE — Assessment & Plan Note (Signed)
 Mild to mod, for antibx course zpack, and hycodan prn,  to f/u any worsening symptoms or concerns

## 2024-08-31 NOTE — Assessment & Plan Note (Signed)
 BP Readings from Last 3 Encounters:  08/31/24 108/68  03/24/24 130/80  11/13/23 110/70   Stable, pt to continue medical treatment norvaasc 10 every day, maxide 25 qd

## 2024-08-31 NOTE — Patient Instructions (Signed)
 Please take all new medication as prescribed - the antibiotic, and cough medicine as needed  Please continue all other medications as before, and refills have been done if requested.  Please have the pharmacy call with any other refills you may need.  Please keep your appointments with your specialists as you may have planned
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
## 2024-08-31 NOTE — Assessment & Plan Note (Addendum)
 Lab Results  Component Value Date   HGBA1C 7.0 (H) 03/24/2024   uncontrolled, pt to continue current medical treatment  - glucotrol  xl 5 every day, diet ,wt control, decline other change

## 2024-09-23 ENCOUNTER — Ambulatory Visit: Admitting: Internal Medicine

## 2024-09-23 ENCOUNTER — Ambulatory Visit: Payer: Self-pay | Admitting: Internal Medicine

## 2024-09-23 ENCOUNTER — Encounter: Payer: Self-pay | Admitting: Internal Medicine

## 2024-09-23 VITALS — BP 120/74 | HR 55 | Temp 97.7°F | Ht 69.0 in | Wt 187.0 lb

## 2024-09-23 DIAGNOSIS — E559 Vitamin D deficiency, unspecified: Secondary | ICD-10-CM | POA: Diagnosis not present

## 2024-09-23 DIAGNOSIS — E1122 Type 2 diabetes mellitus with diabetic chronic kidney disease: Secondary | ICD-10-CM

## 2024-09-23 DIAGNOSIS — N529 Male erectile dysfunction, unspecified: Secondary | ICD-10-CM

## 2024-09-23 DIAGNOSIS — I10 Essential (primary) hypertension: Secondary | ICD-10-CM | POA: Diagnosis not present

## 2024-09-23 DIAGNOSIS — E7800 Pure hypercholesterolemia, unspecified: Secondary | ICD-10-CM

## 2024-09-23 DIAGNOSIS — E1165 Type 2 diabetes mellitus with hyperglycemia: Secondary | ICD-10-CM

## 2024-09-23 DIAGNOSIS — N1831 Chronic kidney disease, stage 3a: Secondary | ICD-10-CM

## 2024-09-23 LAB — BASIC METABOLIC PANEL WITH GFR
BUN: 19 mg/dL (ref 6–23)
CO2: 30 meq/L (ref 19–32)
Calcium: 9.3 mg/dL (ref 8.4–10.5)
Chloride: 100 meq/L (ref 96–112)
Creatinine, Ser: 2.09 mg/dL — ABNORMAL HIGH (ref 0.40–1.50)
GFR: 30.52 mL/min — ABNORMAL LOW (ref >60.00)
Glucose, Bld: 98 mg/dL (ref 70–99)
Potassium: 3.8 meq/L (ref 3.5–5.1)
Sodium: 138 meq/L (ref 135–145)

## 2024-09-23 LAB — HEMOGLOBIN A1C: Hgb A1c MFr Bld: 6.6 % — ABNORMAL HIGH (ref 4.6–6.5)

## 2024-09-23 LAB — LIPID PANEL
Cholesterol: 123 mg/dL (ref 0–200)
HDL: 41.9 mg/dL (ref >39.00)
LDL Cholesterol: 61 mg/dL (ref 0–99)
NonHDL: 81.01
Total CHOL/HDL Ratio: 3
Triglycerides: 100 mg/dL (ref 0.0–149.0)
VLDL: 20 mg/dL (ref 0.0–40.0)

## 2024-09-23 LAB — CBC WITH DIFFERENTIAL/PLATELET
Basophils Absolute: 0 K/uL (ref 0.0–0.1)
Basophils Relative: 0.9 % (ref 0.0–3.0)
Eosinophils Absolute: 0.1 K/uL (ref 0.0–0.7)
Eosinophils Relative: 2.4 % (ref 0.0–5.0)
HCT: 43.4 % (ref 39.0–52.0)
Hemoglobin: 14 g/dL (ref 13.0–17.0)
Lymphocytes Relative: 37.9 % (ref 12.0–46.0)
Lymphs Abs: 2.1 K/uL (ref 0.7–4.0)
MCHC: 32.2 g/dL (ref 30.0–36.0)
MCV: 85.6 fl (ref 78.0–100.0)
Monocytes Absolute: 0.7 K/uL (ref 0.1–1.0)
Monocytes Relative: 13.1 % — ABNORMAL HIGH (ref 3.0–12.0)
Neutro Abs: 2.6 K/uL (ref 1.4–7.7)
Neutrophils Relative %: 45.7 % (ref 43.0–77.0)
Platelets: 334 K/uL (ref 150.0–400.0)
RBC: 5.07 Mil/uL (ref 4.22–5.81)
RDW: 16.6 % — ABNORMAL HIGH (ref 11.5–15.5)
WBC: 5.7 K/uL (ref 4.0–10.5)

## 2024-09-23 LAB — HEPATIC FUNCTION PANEL
ALT: 24 U/L (ref 0–53)
AST: 35 U/L (ref 0–37)
Albumin: 4.4 g/dL (ref 3.5–5.2)
Alkaline Phosphatase: 71 U/L (ref 39–117)
Bilirubin, Direct: 0.3 mg/dL (ref 0.0–0.3)
Total Bilirubin: 1 mg/dL (ref 0.2–1.2)
Total Protein: 7.7 g/dL (ref 6.0–8.3)

## 2024-09-23 LAB — VITAMIN D 25 HYDROXY (VIT D DEFICIENCY, FRACTURES): VITD: 56.74 ng/mL (ref 30.00–100.00)

## 2024-09-23 MED ORDER — TADALAFIL 20 MG PO TABS: 20.0000 mg | ORAL_TABLET | Freq: Every day | ORAL | 11 refills | Status: AC | PRN
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
## 2024-09-23 NOTE — Assessment & Plan Note (Addendum)
 Lab Results  Component Value Date   HGBA1C 6.6 (H) 09/23/2024   Stable, pt to continue current medical treatment glucotrol  xl 5 mg every day  Ckd3a Lab Results  Component Value Date   CREATININE 2.09 (H) 09/23/2024   Stable overall, cont to avoid nephrotoxins, f/u renal as planned

## 2024-09-23 NOTE — Assessment & Plan Note (Signed)
 Mild to mod, for cialis 20 mg prn,  to f/u any worsening symptoms or concerns

## 2024-09-23 NOTE — Patient Instructions (Signed)
 Please take all new medication as prescribed   - the cialis as needed  Please continue all other medications as before, and refills have been done if requested.  Please have the pharmacy call with any other refills you may need.  Please continue your efforts at being more active, low cholesterol diet, and weight control.  Please keep your appointments with your specialists as you may have planned  Please go to the LAB at the blood drawing area for the tests to be done  You will be contacted by phone if any changes need to be made immediately.  Otherwise, you will receive a letter about your results with an explanation, but please check with MyChart first.  Please make an Appointment to return in 6 months, or sooner if needed

## 2024-09-23 NOTE — Assessment & Plan Note (Signed)
 Lab Results  Component Value Date   LDLCALC 61 09/23/2024   Stable, pt to continue current statin lipitor 80n mg every day, zetia  10 mg qd

## 2024-09-23 NOTE — Assessment & Plan Note (Addendum)
 Ckd3a Lab Results  Component Value Date   CREATININE 2.09 (H) 09/23/2024   Stable overall, cont to avoid nephrotoxins, f/u renal yearly as planned

## 2024-09-23 NOTE — Assessment & Plan Note (Signed)
 BP Readings from Last 3 Encounters:  09/23/24 120/74  08/31/24 108/68  03/24/24 130/80   Stable, pt to continue medical treatment norvasc  10 qd

## 2024-09-23 NOTE — Assessment & Plan Note (Signed)
 Last vitamin D  Lab Results  Component Value Date   VD25OH 56.74 09/23/2024   Stable, cont oral replacement

## 2024-09-29 ENCOUNTER — Ambulatory Visit (INDEPENDENT_AMBULATORY_CARE_PROVIDER_SITE_OTHER)

## 2024-09-29 VITALS — Ht 69.0 in | Wt 187.0 lb

## 2024-09-29 DIAGNOSIS — Z0000 Encounter for general adult medical examination without abnormal findings: Secondary | ICD-10-CM | POA: Diagnosis not present
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
## 2024-09-29 NOTE — Patient Instructions (Addendum)
 Calvin Wilcox,  Thank you for taking the time for your Medicare Wellness Visit. I appreciate your continued commitment to your health goals. Please review the care plan we discussed, and feel free to reach out if I can assist you further.  Medicare recommends these wellness visits once per year to help you and your care team stay ahead of potential health issues. These visits are designed to focus on prevention, allowing your provider to concentrate on managing your acute and chronic conditions during your regular appointments.  Please note that Annual Wellness Visits do not include a physical exam. Some assessments may be limited, especially if the visit was conducted virtually. If needed, we may recommend a separate in-person follow-up with your provider.  Ongoing Care Seeing your primary care provider every 3 to 6 months helps us  monitor your health and provide consistent, personalized care.   Referrals If a referral was made during today's visit and you haven't received any updates within two weeks, please contact the referred provider directly to check on the status.  Recommended Screenings:  Health Maintenance  Topic Date Due   Zoster (Shingles) Vaccine (1 of 2) Never done   Flu Shot  03/09/2025*   Eye exam for diabetics  01/13/2025   Yearly kidney health urinalysis for diabetes  03/24/2025   Complete foot exam   03/24/2025   Hemoglobin A1C  03/24/2025   Yearly kidney function blood test for diabetes  09/23/2025   Medicare Annual Wellness Visit  09/29/2025   Colon Cancer Screening  12/21/2026   DTaP/Tdap/Td vaccine (5 - Td or Tdap) 11/18/2029   Pneumococcal Vaccine for age over 97  Completed   Hepatitis C Screening  Completed   Meningitis B Vaccine  Aged Out   COVID-19 Vaccine  Discontinued  *Topic was postponed. The date shown is not the original due date.       09/29/2024    1:55 PM  Advanced Directives  Does Patient Have a Medical Advance Directive? No  Would patient like  information on creating a medical advance directive? Yes (MAU/Ambulatory/Procedural Areas - Information given)   Advance Care Planning is important because it: Ensures you receive medical care that aligns with your values, goals, and preferences. Provides guidance to your family and loved ones, reducing the emotional burden of decision-making during critical moments.  Vision: Annual vision screenings are recommended for early detection of glaucoma, cataracts, and diabetic retinopathy. These exams can also reveal signs of chronic conditions such as diabetes and high blood pressure.  Dental: Annual dental screenings help detect early signs of oral cancer, gum disease, and other conditions linked to overall health, including heart disease and diabetes.

## 2024-09-30 ENCOUNTER — Ambulatory Visit: Admitting: Internal Medicine

## 2025-03-23 ENCOUNTER — Ambulatory Visit: Admitting: Internal Medicine

## 2025-10-05 ENCOUNTER — Encounter: Admitting: Internal Medicine

## 2025-10-05 ENCOUNTER — Ambulatory Visit
